# Patient Record
Sex: Female | Born: 1950 | Race: White | Hispanic: No | Marital: Married | Smoking: Never smoker
Health system: Southern US, Community
[De-identification: ages and names within clinical notes are randomized; demographics above are authoritative.]

## PROBLEM LIST (undated history)

## (undated) DIAGNOSIS — H269 Unspecified cataract: Secondary | ICD-10-CM

## (undated) DIAGNOSIS — I10 Essential (primary) hypertension: Secondary | ICD-10-CM

## (undated) DIAGNOSIS — I341 Nonrheumatic mitral (valve) prolapse: Secondary | ICD-10-CM

## (undated) DIAGNOSIS — G43909 Migraine, unspecified, not intractable, without status migrainosus: Secondary | ICD-10-CM

## (undated) HISTORY — DX: Nonrheumatic mitral (valve) prolapse: I34.1

## (undated) HISTORY — PX: BREAST BIOPSY: SHX20

## (undated) HISTORY — DX: Migraine, unspecified, not intractable, without status migrainosus: G43.909

## (undated) HISTORY — DX: Unspecified cataract: H26.9

## (undated) HISTORY — PX: MENISCUS REPAIR: SHX5179

## (undated) HISTORY — DX: Essential (primary) hypertension: I10

---

## 1990-08-03 HISTORY — PX: ABDOMINAL HYSTERECTOMY: SHX81

## 1991-08-04 HISTORY — PX: BREAST SURGERY: SHX581

## 2010-03-26 ENCOUNTER — Encounter: Payer: Self-pay | Admitting: Emergency Medicine

## 2010-04-09 ENCOUNTER — Encounter: Payer: Self-pay | Admitting: Emergency Medicine

## 2010-05-19 ENCOUNTER — Encounter: Payer: Self-pay | Admitting: Emergency Medicine

## 2010-07-05 ENCOUNTER — Encounter: Payer: Self-pay | Admitting: Emergency Medicine

## 2010-09-15 ENCOUNTER — Encounter: Payer: Self-pay | Admitting: Emergency Medicine

## 2010-09-15 ENCOUNTER — Institutional Professional Consult (permissible substitution) (INDEPENDENT_AMBULATORY_CARE_PROVIDER_SITE_OTHER): Payer: BC Managed Care – PPO | Admitting: Emergency Medicine

## 2010-09-15 DIAGNOSIS — I10 Essential (primary) hypertension: Secondary | ICD-10-CM | POA: Insufficient documentation

## 2010-09-15 DIAGNOSIS — R9389 Abnormal findings on diagnostic imaging of other specified body structures: Secondary | ICD-10-CM | POA: Insufficient documentation

## 2010-09-15 DIAGNOSIS — J309 Allergic rhinitis, unspecified: Secondary | ICD-10-CM | POA: Insufficient documentation

## 2010-09-24 NOTE — Assessment & Plan Note (Signed)
Summary: abnormal CT scan, RUL scarring   Visit Type:  Initial Consult Primary Provider/Referring Provider:  Dr. Maye Hides  CC:  Pt here for abnormal CT chest..  History of Present Illness: 60 yo former minimal smoker, hx HTN, allergies.  Established care with Dr Jonny Ruiz [facility]ier, [place]) in Fall 2011. Felt well at the time, asymptomatic. Had a screening CXR that questioned R sided infiltrate. Persisted on repeat CXR, which prompted CT scan of the chest in 12/11.   Preventive Screening-Counseling & Management  Alcohol-Tobacco     Smoking Status: quit     Packs/Day: 0.75     Year Started: 1975     Year Quit: 1980     Pack years: 3  Current Medications (verified): 1)  Micardis Hct 40-12.5 Mg Tabs (Telmisartan-Hctz) .Marland Kitchen.. 1 By Mouth Daily 2)  Multivitamins  Tabs (Multiple Vitamin) .Marland Kitchen.. 1 By Mouth Daily 3)  Vitamin D3 1000 Unit Tabs (Cholecalciferol) .Marland Kitchen.. 1 By Mouth Daily 4)  Calcium 1500 Mg Tabs (Calcium Carbonate) .Marland Kitchen.. 1 By Mouth Daily 5)  Fish Oil 500 Mg Caps (Omega-3 Fatty Acids) .Marland Kitchen.. 1 By Mouth Daily 6)  Alfalfa 250 Mg Tabs (Alfalfa) .Marland Kitchen.. 1 By Mouth As Needed  Allergies (verified): 1)  ! Sulfa  Past History:  Past Medical History: Current Problems:  HYPERTENSION (ICD-401.9) ALLERGIC RHINITIS (ICD-477.9)  Past Surgical History: Hysterectomy  in 02/1991  Family History: Seasonal allergies --- mother and father Melanoma ---father  Social History: Patient states former smoker.  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 No inhaled exposures. Smoking Status:  quit Packs/Day:  0.75 Pack years:  3  Review of Systems       The patient complains of nasal congestion/difficulty breathing through nose and change in color of mucus.  The patient denies shortness of breath with activity, shortness of breath at rest, productive cough, non-productive cough, coughing up blood, chest pain,  irregular heartbeats, acid heartburn, indigestion, loss of appetite, weight change, abdominal pain, difficulty swallowing, sore throat, tooth/dental problems, headaches, sneezing, itching, ear ache, anxiety, depression, hand/feet swelling, joint stiffness or pain, rash, and fever.    Vital Signs:  Patient profile:   60 year old female Height:      63 inches (160.02 cm) Weight:      160 pounds (72.73 kg) BMI:     28.45 O2 Sat:      98 % on Room air Temp:     98.3 degrees F (36.83 degrees C) oral Pulse rate:   106 / minute BP sitting:   110 / 64  (right arm) Cuff size:   regular  Vitals Entered By: Michel Bickers CMA (September 15, 2010 9:55 AM)  O2 Sat at Rest %:  98 O2 Flow:  Room air CC: Pt here for abnormal CT chest. Is Patient Diabetic? No Comments Medications reviewed with patient Michel Bickers CMA  September 15, 2010 9:55 AM   Physical Exam  General:  normal appearance and healthy appearing.   Head:  normocephalic and atraumatic Eyes:  conjunctiva and sclera clear Nose:  no deformity, discharge, inflammation, or lesions Mouth:  no deformity or lesions Neck:  no masses, thyromegaly, or abnormal cervical nodes Lungs:  clear bilaterally to auscultation and percussion Heart:  regular rate and rhythm, S1, S2 without murmurs, rubs, gallops, or clicks Abdomen:  not examined Extremities:  no clubbing, cyanosis, edema, or deformity noted Neurologic:  non-focal  Skin:  intact  without lesions or rashes Psych:  alert and cooperative; normal mood and affect; normal attention span and concentration   Impression & Recommendations:  Problem # 1:  OTH NONSPC ABN FINDNG RAD&OTH EXM BODY STRUCTURE (ICD-793.99)  ABnormal CT scan of the chest with non-specific interstitial prominence, no mass or nodules. There are calcified mediastinal nodes consistent w Histo. I suspect this is a chronic and harmless finding - she is asymptomatic.  - will plan to follow this w a CXR in 1 year or sooner if she  develops any symptoms.   Orders: Consultation Level IV (16109)  Medications Added to Medication List This Visit: 1)  Micardis Hct 40-12.5 Mg Tabs (Telmisartan-hctz) .Marland Kitchen.. 1 by mouth daily 2)  Multivitamins Tabs (Multiple vitamin) .Marland Kitchen.. 1 by mouth daily 3)  Vitamin D3 1000 Unit Tabs (Cholecalciferol) .Marland Kitchen.. 1 by mouth daily 4)  Calcium 1500 Mg Tabs (Calcium carbonate) .Marland Kitchen.. 1 by mouth daily 5)  Fish Oil 500 Mg Caps (Omega-3 fatty acids) .Marland Kitchen.. 1 by mouth daily 6)  Alfalfa 250 Mg Tabs (Alfalfa) .Marland Kitchen.. 1 by mouth as needed  Patient Instructions: 1)  Your CXR's and CT scan show an area on the right that looks like scarring.  2)  You should have a repeat CXR in a year (and probably annually) to insure that this area is stable. We would repeat your CXR sooner if you developed any breathing symptoms.  3)  Follow up with Dr Delton Coombes in 1 year to review a CXR.

## 2011-03-27 ENCOUNTER — Ambulatory Visit: Payer: BC Managed Care – PPO | Admitting: Family Medicine

## 2011-04-03 ENCOUNTER — Ambulatory Visit (INDEPENDENT_AMBULATORY_CARE_PROVIDER_SITE_OTHER): Payer: BC Managed Care – PPO | Admitting: Family Medicine

## 2011-04-03 ENCOUNTER — Encounter: Payer: Self-pay | Admitting: Family Medicine

## 2011-04-03 VITALS — BP 112/80 | HR 68 | Temp 98.2°F | Ht 63.0 in | Wt 161.0 lb

## 2011-04-03 DIAGNOSIS — I10 Essential (primary) hypertension: Secondary | ICD-10-CM

## 2011-04-03 LAB — POCT URINALYSIS DIPSTICK
Bilirubin, UA: NEGATIVE
Glucose, UA: NEGATIVE
Leukocytes, UA: NEGATIVE
Urobilinogen, UA: 0.2
pH, UA: 6.5

## 2011-04-03 LAB — BASIC METABOLIC PANEL
CO2: 27 mEq/L (ref 19–32)
Calcium: 9.6 mg/dL (ref 8.4–10.5)
Chloride: 110 mEq/L (ref 96–112)
Creatinine, Ser: 0.8 mg/dL (ref 0.4–1.2)
GFR: 78.24 mL/min (ref >60.00)
Potassium: 4.7 mEq/L (ref 3.5–5.1)

## 2011-04-03 LAB — CBC WITH DIFFERENTIAL/PLATELET
Basophils Relative: 0.5 % (ref 0.0–3.0)
Hemoglobin: 14.1 g/dL (ref 12.0–15.0)
Lymphocytes Relative: 32.6 % (ref 12.0–46.0)
Monocytes Relative: 6.4 % (ref 3.0–12.0)
Neutro Abs: 3.3 10*3/uL (ref 1.4–7.7)
RBC: 4.77 Mil/uL (ref 3.87–5.11)
WBC: 5.8 10*3/uL (ref 4.5–10.5)

## 2011-04-03 LAB — TSH: TSH: 2.44 u[IU]/mL (ref 0.35–5.50)

## 2011-04-03 LAB — LIPID PANEL
LDL Cholesterol: 88 mg/dL (ref 0–99)
Total CHOL/HDL Ratio: 4

## 2011-04-03 LAB — HEPATIC FUNCTION PANEL: Albumin: 4.2 g/dL (ref 3.5–5.2)

## 2011-04-03 MED ORDER — LOSARTAN POTASSIUM 50 MG PO TABS: 50.0000 mg | ORAL_TABLET | Freq: Every day | ORAL | Status: DC

## 2011-04-03 NOTE — Progress Notes (Signed)
  Subjective:    Patient ID: Kelly Holder, female    DOB: Jan 23, 1951, 60 y.o.   MRN: [id number]  HPI 60 yr old female to establish with [place] after transfering from Dr. Maye Hides in [facility]. She feels well with no concerns. She needs refills on her BP med. She is exercising and trying to lose weight. 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    Review of Systems  Constitutional: Negative.   Respiratory: Negative.   Cardiovascular: Negative.        Objective:   Physical Exam  Constitutional: She appears well-developed and well-nourished.  Neck: No thyromegaly present.  Cardiovascular: Normal rate, regular rhythm, normal heart sounds and intact distal pulses.  Exam reveals no gallop and no friction rub.   No murmur heard. Pulmonary/Chest: Effort normal and breath sounds normal. No respiratory distress. She has no wheezes. She has no rales.  Lymphadenopathy:    She has no cervical adenopathy.          Assessment & Plan:  Her HTN is stable, so we refilled her meds. Get fasting labs today Continue with weight loss efforts.

## 2012-04-07 ENCOUNTER — Other Ambulatory Visit: Payer: Self-pay | Admitting: Family Medicine

## 2012-04-08 NOTE — Telephone Encounter (Signed)
Call in #30 only, she needs an OV  

## 2012-04-08 NOTE — Telephone Encounter (Signed)
Can we refill this? 

## 2012-04-11 ENCOUNTER — Other Ambulatory Visit (INDEPENDENT_AMBULATORY_CARE_PROVIDER_SITE_OTHER): Payer: BC Managed Care – PPO

## 2012-04-11 DIAGNOSIS — Z0000 Encounter for general adult medical examination without abnormal findings: Secondary | ICD-10-CM

## 2012-04-11 LAB — HEPATIC FUNCTION PANEL
ALT: 24 U/L (ref 0–35)
AST: 26 U/L (ref 0–37)
Alkaline Phosphatase: 59 U/L (ref 39–117)
Bilirubin, Direct: 0.1 mg/dL (ref 0.0–0.3)
Total Bilirubin: 0.4 mg/dL (ref 0.3–1.2)
Total Protein: 6.7 g/dL (ref 6.0–8.3)

## 2012-04-11 LAB — CBC WITH DIFFERENTIAL/PLATELET
Basophils Relative: 0.7 % (ref 0.0–3.0)
Eosinophils Relative: 3.4 % (ref 0.0–5.0)
MCV: 89 fl (ref 78.0–100.0)
Monocytes Absolute: 0.4 10*3/uL (ref 0.1–1.0)
Monocytes Relative: 6.8 % (ref 3.0–12.0)
Neutrophils Relative %: 57.8 % (ref 43.0–77.0)
Platelets: 165 10*3/uL (ref 150.0–400.0)
RBC: 4.67 Mil/uL (ref 3.87–5.11)
WBC: 6.2 10*3/uL (ref 4.5–10.5)

## 2012-04-11 LAB — BASIC METABOLIC PANEL
BUN: 9 mg/dL (ref 6–23)
Chloride: 107 mEq/L (ref 96–112)
Creatinine, Ser: 0.9 mg/dL (ref 0.4–1.2)
GFR: 66.35 mL/min (ref >60.00)
Potassium: 4.6 mEq/L (ref 3.5–5.1)

## 2012-04-11 LAB — POCT URINALYSIS DIPSTICK
Glucose, UA: NEGATIVE
Nitrite, UA: NEGATIVE
Protein, UA: NEGATIVE
Spec Grav, UA: <=1.005
Urobilinogen, UA: 0.2

## 2012-04-11 LAB — LIPID PANEL
Cholesterol: 136 mg/dL (ref 0–200)
LDL Cholesterol: 79 mg/dL (ref 0–99)
Total CHOL/HDL Ratio: 4
VLDL: 20.6 mg/dL (ref 0.0–40.0)

## 2012-04-15 NOTE — Progress Notes (Signed)
Quick Note:  I left voice message with results. ______ 

## 2012-05-16 ENCOUNTER — Ambulatory Visit (INDEPENDENT_AMBULATORY_CARE_PROVIDER_SITE_OTHER): Payer: BC Managed Care – PPO | Admitting: Family Medicine

## 2012-05-16 ENCOUNTER — Encounter: Payer: Self-pay | Admitting: Family Medicine

## 2012-05-16 VITALS — BP 110/68 | HR 83 | Temp 98.1°F | Ht 63.25 in | Wt 149.0 lb

## 2012-05-16 DIAGNOSIS — Z23 Encounter for immunization: Secondary | ICD-10-CM

## 2012-05-16 DIAGNOSIS — Z0000 Encounter for general adult medical examination without abnormal findings: Secondary | ICD-10-CM

## 2012-05-16 MED ORDER — LOSARTAN POTASSIUM 50 MG PO TABS: 50.0000 mg | ORAL_TABLET | Freq: Every day | ORAL | Status: DC

## 2012-05-16 NOTE — Progress Notes (Signed)
  Subjective:    Patient ID: Kelly Holder, female    DOB: 01-17-1951, 61 y.o.   MRN: [id number]  HPI 61 yr old female for a cpx. She feels well and has no concerns.    Review of Systems  Constitutional: Negative.   HENT: Negative.   Eyes: Negative.   Respiratory: Negative.   Cardiovascular: Negative.   Gastrointestinal: Negative.   Genitourinary: Negative for dysuria, urgency, frequency, hematuria, flank pain, decreased urine volume, enuresis, difficulty urinating, pelvic pain and dyspareunia.  Musculoskeletal: Negative.   Skin: Negative.   Neurological: Negative.   Hematological: Negative.   Psychiatric/Behavioral: Negative.        Objective:   Physical Exam  Constitutional: She is oriented to person, place, and time. She appears well-developed and well-nourished. No distress.  HENT:  Head: Normocephalic and atraumatic.  Right Ear: External ear normal.  Left Ear: External ear normal.  Nose: Nose normal.  Mouth/Throat: Oropharynx is clear and moist. No oropharyngeal exudate.  Eyes: Conjunctivae normal and EOM are normal. Pupils are equal, round, and reactive to light. No scleral icterus.  Neck: Normal range of motion. Neck supple. No JVD present. No thyromegaly present.  Cardiovascular: Normal rate, regular rhythm, normal heart sounds and intact distal pulses.  Exam reveals no gallop and no friction rub.   No murmur heard. Pulmonary/Chest: Effort normal and breath sounds normal. No respiratory distress. She has no wheezes. She has no rales. She exhibits no tenderness.  Abdominal: Soft. Bowel sounds are normal. She exhibits no distension and no mass. There is no tenderness. There is no rebound and no guarding.  Musculoskeletal: Normal range of motion. She exhibits no edema and no tenderness.  Lymphadenopathy:    She has no cervical adenopathy.  Neurological: She is alert and oriented to person, place, and time. She has normal reflexes. No cranial nerve deficit. She exhibits  normal muscle tone. Coordination normal.  Skin: Skin is warm and dry. No rash noted. No erythema.  Psychiatric: She has a normal mood and affect. Her behavior is normal. Judgment and thought content normal.          Assessment & Plan:  Well exam. She needs a mammogram soon.

## 2012-05-16 NOTE — Addendum Note (Signed)
Addended by: Aniceto Boss A on: 05/16/2012 05:23 PM   Modules accepted: Orders

## 2012-08-01 ENCOUNTER — Telehealth: Payer: Self-pay | Admitting: Family Medicine

## 2012-08-01 NOTE — Telephone Encounter (Signed)
Pt would like her meds sent to new pharm: [facility]. Pls change. Thank you!!

## 2012-08-02 MED ORDER — LOSARTAN POTASSIUM 50 MG PO TABS: 50.0000 mg | ORAL_TABLET | Freq: Every day | ORAL | Status: DC

## 2012-08-02 NOTE — Telephone Encounter (Signed)
I sent script [facility]. 

## 2012-09-22 ENCOUNTER — Encounter: Payer: Self-pay | Admitting: Family Medicine

## 2012-11-22 ENCOUNTER — Other Ambulatory Visit: Payer: Self-pay | Admitting: Family Medicine

## 2013-02-19 ENCOUNTER — Other Ambulatory Visit: Payer: Self-pay | Admitting: Family Medicine

## 2013-02-27 ENCOUNTER — Ambulatory Visit (INDEPENDENT_AMBULATORY_CARE_PROVIDER_SITE_OTHER): Payer: BC Managed Care – PPO | Admitting: Family Medicine

## 2013-02-27 ENCOUNTER — Encounter: Payer: Self-pay | Admitting: Family Medicine

## 2013-02-27 VITALS — BP 122/66 | HR 79 | Temp 98.3°F | Wt 148.0 lb

## 2013-02-27 DIAGNOSIS — I10 Essential (primary) hypertension: Secondary | ICD-10-CM

## 2013-02-27 MED ORDER — LOSARTAN POTASSIUM 50 MG PO TABS: 50.0000 mg | ORAL_TABLET | Freq: Every day | ORAL | Status: DC

## 2013-02-27 NOTE — Progress Notes (Signed)
  Subjective:    Patient ID: Kelly Holder, female    DOB: [birth year], 62 y.o.   MRN: [id number]  HPI Here to follow up. Her BP is stable and she feels great. She has lost 25 lbs in the past year.    Review of Systems  Constitutional: Negative.   Respiratory: Negative.   Cardiovascular: Negative.        Objective:   Physical Exam  Constitutional: She appears well-developed and well-nourished.  Cardiovascular: Normal rate, regular rhythm, normal heart sounds and intact distal pulses.   Pulmonary/Chest: Effort normal and breath sounds normal.          Assessment & Plan:  Doing well. refilled meds.

## 2013-04-21 ENCOUNTER — Other Ambulatory Visit: Payer: Self-pay | Admitting: Family Medicine

## 2013-04-21 ENCOUNTER — Telehealth: Payer: Self-pay | Admitting: Family Medicine

## 2013-04-21 DIAGNOSIS — Z0000 Encounter for general adult medical examination without abnormal findings: Secondary | ICD-10-CM

## 2013-04-21 NOTE — Telephone Encounter (Signed)
Pt came in for med ck on 7/28. Was unable to do labs b/c pt was not fasting. Pt doesn't think she needs a cpe this year since she was just in. Pt would like to know if we would sch fasting cpe labs for her? Pt needs for wellness program for her employer

## 2013-04-21 NOTE — Telephone Encounter (Signed)
I put future lab order in computer, can you call pt to schedule the lab appointment? 

## 2013-04-21 NOTE — Telephone Encounter (Signed)
I agree, no need for a cpx. Set her up for fasting labs though

## 2013-04-24 NOTE — Telephone Encounter (Addendum)
done/kh

## 2013-04-25 ENCOUNTER — Encounter: Payer: Self-pay | Admitting: Family Medicine

## 2013-04-28 ENCOUNTER — Other Ambulatory Visit (INDEPENDENT_AMBULATORY_CARE_PROVIDER_SITE_OTHER): Payer: BC Managed Care – PPO

## 2013-04-28 DIAGNOSIS — Z0000 Encounter for general adult medical examination without abnormal findings: Secondary | ICD-10-CM

## 2013-04-28 LAB — LIPID PANEL
HDL: 45.9 mg/dL (ref >39.00)
LDL Cholesterol: 109 mg/dL — ABNORMAL HIGH (ref 0–99)
Total CHOL/HDL Ratio: 4
VLDL: 25.6 mg/dL (ref 0.0–40.0)

## 2013-04-28 LAB — HEPATIC FUNCTION PANEL
ALT: 19 U/L (ref 0–35)
Alkaline Phosphatase: 52 U/L (ref 39–117)
Total Bilirubin: 0.7 mg/dL (ref 0.3–1.2)

## 2013-04-28 LAB — CBC WITH DIFFERENTIAL/PLATELET
Basophils Relative: 0.6 % (ref 0.0–3.0)
Eosinophils Relative: 2.7 % (ref 0.0–5.0)
Hemoglobin: 14.8 g/dL (ref 12.0–15.0)
Lymphocytes Relative: 35.6 % (ref 12.0–46.0)
MCV: 87.8 fl (ref 78.0–100.0)
Monocytes Relative: 6.4 % (ref 3.0–12.0)
Neutrophils Relative %: 54.7 % (ref 43.0–77.0)
Platelets: 196 10*3/uL (ref 150.0–400.0)
RBC: 5 Mil/uL (ref 3.87–5.11)
WBC: 7.2 10*3/uL (ref 4.5–10.5)

## 2013-04-28 LAB — POCT URINALYSIS DIPSTICK
Bilirubin, UA: NEGATIVE
Glucose, UA: NEGATIVE
Leukocytes, UA: NEGATIVE
Nitrite, UA: NEGATIVE
Protein, UA: NEGATIVE
Spec Grav, UA: <=1.005
Urobilinogen, UA: 0.2

## 2013-04-28 LAB — BASIC METABOLIC PANEL
BUN: 10 mg/dL (ref 6–23)
CO2: 26 mEq/L (ref 19–32)
Chloride: 104 mEq/L (ref 96–112)
Glucose, Bld: 85 mg/dL (ref 70–99)
Potassium: 4.7 mEq/L (ref 3.5–5.1)

## 2013-05-01 NOTE — Progress Notes (Signed)
Quick Note:  I released results in my chart. ______ 

## 2013-06-08 ENCOUNTER — Other Ambulatory Visit: Payer: Self-pay

## 2013-10-11 ENCOUNTER — Telehealth: Payer: Self-pay | Admitting: Family Medicine

## 2013-10-11 NOTE — Telephone Encounter (Signed)
[facility] - [place], [place] requesting new script for losartan (COZAAR) 50 MG tablet

## 2013-10-12 MED ORDER — LOSARTAN POTASSIUM 50 MG PO TABS: 50.0000 mg | ORAL_TABLET | Freq: Every day | ORAL | Status: DC

## 2013-10-12 NOTE — Telephone Encounter (Signed)
I sent script [facility]. 

## 2013-10-31 ENCOUNTER — Telehealth: Payer: Self-pay | Admitting: Family Medicine

## 2013-10-31 NOTE — Telephone Encounter (Signed)
Left message on machine for patient to call back and schedule an appointment. 

## 2013-10-31 NOTE — Telephone Encounter (Signed)
Patient Information:  Caller Name: Maie  Phone: [phone]  Patient: Kelly Holder  Gender: Female  DOB: 1951/06/10  Age: 63 Years  PCP: [name] [facility])  Office Follow Up:  Does the office need to follow up with this patient?: Yes  Instructions For The Office: Please f/u with pt concerning high blood pressure reading over the past few days.  Thank you.   Symptoms  Reason For Call & Symptoms: Pt reports she has history of HTN and BP 170/100, symptoms dizziness, headache  on pain scale 1 top of the head to over the ears. Pt concerned and would like to talk with provider concerning change in medication.  Reviewed Health History In EMR: Yes  Reviewed Medications In EMR: Yes  Reviewed Allergies In EMR: Yes  Reviewed Surgeries / Procedures: Yes  Date of Onset of Symptoms: 10/31/2013  Guideline(s) Used:  High Blood Pressure  Disposition Per Guideline:   See Within 2 Weeks in Office  Reason For Disposition Reached:   BP > 160/100  Advice Given:  Call Back If:  You become worse.  Patient Refused Recommendation:  Patient Requests Prescription  Pt asking about medication adjustment.  [facility] uses [facility] [place], [place]

## 2013-10-31 NOTE — Telephone Encounter (Signed)
Pt has been sch for tommorrow

## 2013-11-01 ENCOUNTER — Telehealth: Payer: Self-pay | Admitting: Family Medicine

## 2013-11-01 ENCOUNTER — Ambulatory Visit (INDEPENDENT_AMBULATORY_CARE_PROVIDER_SITE_OTHER): Payer: BC Managed Care – PPO | Admitting: Family Medicine

## 2013-11-01 ENCOUNTER — Encounter: Payer: Self-pay | Admitting: Family Medicine

## 2013-11-01 VITALS — BP 132/70 | HR 73 | Temp 98.7°F | Ht 63.25 in | Wt 150.0 lb

## 2013-11-01 DIAGNOSIS — I10 Essential (primary) hypertension: Secondary | ICD-10-CM

## 2013-11-01 MED ORDER — HYDROCHLOROTHIAZIDE 12.5 MG PO CAPS: 12.5000 mg | ORAL_CAPSULE | Freq: Every day | ORAL | Status: DC

## 2013-11-01 MED ORDER — LOSARTAN POTASSIUM-HCTZ 50-12.5 MG PO TABS: 1.0000 | ORAL_TABLET | Freq: Every day | ORAL | Status: DC

## 2013-11-01 NOTE — Telephone Encounter (Signed)
Relevant patient education assigned to patient using Emmi. ° °

## 2013-11-01 NOTE — Progress Notes (Signed)
Pre visit review using our clinic review tool, if applicable. No additional management support is needed unless otherwise documented below in the visit note. 

## 2013-11-01 NOTE — Progress Notes (Signed)
   Subjective:    Patient ID: Kelly Holder, female    DOB: 14-Jan-1951, 63 y.o.   MRN: [id number]  HPI Here with concerns over her BP. Over the past 6 months she has gained about 10 lbs and she has stopped exercising. She has been dealing with more stress at work. She had a HA about 10 days ago and found her BP to be 180/100. She has checked it since then about twice a day and it averages 140-160/90. The HA has resolved. No chest pain or SOB.    Review of Systems  Constitutional: Negative.   Respiratory: Negative.   Cardiovascular: Negative.   Neurological: Negative.        Objective:   Physical Exam  Constitutional: She is oriented to person, place, and time. She appears well-developed and well-nourished.  Pulmonary/Chest: Effort normal and breath sounds normal.  Abdominal: Soft. Bowel sounds are normal.  Neurological: She is alert and oriented to person, place, and time.          Assessment & Plan:  We will change to Losartan HCT 50/12.5 daily. Recheck with her cpx in 3 months

## 2014-01-23 ENCOUNTER — Other Ambulatory Visit: Payer: BC Managed Care – PPO

## 2014-02-09 ENCOUNTER — Encounter: Payer: BC Managed Care – PPO | Admitting: Family Medicine

## 2014-03-14 ENCOUNTER — Other Ambulatory Visit (INDEPENDENT_AMBULATORY_CARE_PROVIDER_SITE_OTHER): Payer: BC Managed Care – PPO

## 2014-03-14 DIAGNOSIS — Z0000 Encounter for general adult medical examination without abnormal findings: Secondary | ICD-10-CM

## 2014-03-14 LAB — CBC WITH DIFFERENTIAL/PLATELET
BASOS ABS: 0 10*3/uL (ref 0.0–0.1)
Basophils Relative: 0.6 % (ref 0.0–3.0)
EOS ABS: 0.2 10*3/uL (ref 0.0–0.7)
Eosinophils Relative: 2.2 % (ref 0.0–5.0)
HCT: 42 % (ref 36.0–46.0)
HEMOGLOBIN: 14.1 g/dL (ref 12.0–15.0)
LYMPHS PCT: 35.4 % (ref 12.0–46.0)
Lymphs Abs: 2.7 10*3/uL (ref 0.7–4.0)
MCHC: 33.6 g/dL (ref 30.0–36.0)
MCV: 87.9 fl (ref 78.0–100.0)
Monocytes Absolute: 0.6 10*3/uL (ref 0.1–1.0)
Monocytes Relative: 7.8 % (ref 3.0–12.0)
Neutro Abs: 4.1 10*3/uL (ref 1.4–7.7)
Neutrophils Relative %: 54 % (ref 43.0–77.0)
PLATELETS: 204 10*3/uL (ref 150.0–400.0)
RBC: 4.78 Mil/uL (ref 3.87–5.11)
RDW: 13.3 % (ref 11.5–15.5)
WBC: 7.6 10*3/uL (ref 4.0–10.5)

## 2014-03-14 LAB — BASIC METABOLIC PANEL
BUN: 12 mg/dL (ref 6–23)
CALCIUM: 9.9 mg/dL (ref 8.4–10.5)
CHLORIDE: 101 meq/L (ref 96–112)
CO2: 29 mEq/L (ref 19–32)
Creatinine, Ser: 0.9 mg/dL (ref 0.4–1.2)
GFR: 66.76 mL/min (ref >60.00)
Glucose, Bld: 86 mg/dL (ref 70–99)
Potassium: 4.5 mEq/L (ref 3.5–5.1)
Sodium: 141 mEq/L (ref 135–145)

## 2014-03-14 LAB — LIPID PANEL
CHOLESTEROL: 174 mg/dL (ref 0–200)
HDL: 35.9 mg/dL — AB (ref >39.00)
LDL CALC: 99 mg/dL (ref 0–99)
NonHDL: 138.1
TRIGLYCERIDES: 197 mg/dL — AB (ref 0.0–149.0)
Total CHOL/HDL Ratio: 5
VLDL: 39.4 mg/dL (ref 0.0–40.0)

## 2014-03-14 LAB — HEPATIC FUNCTION PANEL
ALT: 35 U/L (ref 0–35)
AST: 43 U/L — AB (ref 0–37)
Albumin: 4.2 g/dL (ref 3.5–5.2)
Alkaline Phosphatase: 71 U/L (ref 39–117)
BILIRUBIN DIRECT: 0 mg/dL (ref 0.0–0.3)
Total Bilirubin: 0.9 mg/dL (ref 0.2–1.2)
Total Protein: 7.1 g/dL (ref 6.0–8.3)

## 2014-03-14 LAB — POCT URINALYSIS DIPSTICK
Bilirubin, UA: NEGATIVE
Blood, UA: NEGATIVE
GLUCOSE UA: NEGATIVE
Ketones, UA: NEGATIVE
Leukocytes, UA: NEGATIVE
Nitrite, UA: NEGATIVE
Protein, UA: NEGATIVE
Spec Grav, UA: 1.01
UROBILINOGEN UA: 0.2
pH, UA: 7.5

## 2014-03-14 LAB — TSH: TSH: 0.47 u[IU]/mL (ref 0.35–4.50)

## 2014-03-23 ENCOUNTER — Encounter: Payer: Self-pay | Admitting: Family Medicine

## 2014-03-23 ENCOUNTER — Ambulatory Visit (INDEPENDENT_AMBULATORY_CARE_PROVIDER_SITE_OTHER): Payer: BC Managed Care – PPO | Admitting: Family Medicine

## 2014-03-23 VITALS — BP 130/90 | Temp 98.7°F | Ht 63.25 in | Wt 156.0 lb

## 2014-03-23 DIAGNOSIS — I10 Essential (primary) hypertension: Secondary | ICD-10-CM

## 2014-03-23 DIAGNOSIS — Z0000 Encounter for general adult medical examination without abnormal findings: Secondary | ICD-10-CM

## 2014-03-23 MED ORDER — LOSARTAN POTASSIUM-HCTZ 100-25 MG PO TABS: 1.0000 | ORAL_TABLET | Freq: Every day | ORAL | Status: DC

## 2014-03-23 NOTE — Progress Notes (Signed)
   Subjective:    Patient ID: Kelly Holder, female    DOB: 1951-06-26, 63 y.o.   MRN: [id number]  HPI 63 yr old female for a cpx. She feels well in general.    Review of Systems  Constitutional: Negative.   HENT: Negative.   Eyes: Negative.   Respiratory: Negative.   Cardiovascular: Negative.   Gastrointestinal: Negative.   Genitourinary: Negative for dysuria, urgency, frequency, hematuria, flank pain, decreased urine volume, enuresis, difficulty urinating, pelvic pain and dyspareunia.  Musculoskeletal: Negative.   Skin: Negative.   Neurological: Negative.   Psychiatric/Behavioral: Negative.        Objective:   Physical Exam  Constitutional: She is oriented to person, place, and time. She appears well-developed and well-nourished. No distress.  HENT:  Head: Normocephalic and atraumatic.  Right Ear: External ear normal.  Left Ear: External ear normal.  Nose: Nose normal.  Mouth/Throat: Oropharynx is clear and moist. No oropharyngeal exudate.  Eyes: Conjunctivae and EOM are normal. Pupils are equal, round, and reactive to light. No scleral icterus.  Neck: Normal range of motion. Neck supple. No JVD present. No thyromegaly present.  Cardiovascular: Normal rate, regular rhythm, normal heart sounds and intact distal pulses.  Exam reveals no gallop and no friction rub.   No murmur heard. EKG normal   Pulmonary/Chest: Effort normal and breath sounds normal. No respiratory distress. She has no wheezes. She has no rales. She exhibits no tenderness.  Abdominal: Soft. Bowel sounds are normal. She exhibits no distension and no mass. There is no tenderness. There is no rebound and no guarding.  Musculoskeletal: Normal range of motion. She exhibits no edema and no tenderness.  Lymphadenopathy:    She has no cervical adenopathy.  Neurological: She is alert and oriented to person, place, and time. She has normal reflexes. No cranial nerve deficit. She exhibits normal muscle tone. Coordination  normal.  Skin: Skin is warm and dry. No rash noted. No erythema.  Psychiatric: She has a normal mood and affect. Her behavior is normal. Judgment and thought content normal.          Assessment & Plan:  Well exam. We will set up a colonoscopy.

## 2014-03-23 NOTE — Progress Notes (Signed)
Pre visit review using our clinic review tool, if applicable. No additional management support is needed unless otherwise documented below in the visit note. 

## 2014-04-05 ENCOUNTER — Telehealth: Payer: Self-pay | Admitting: Family Medicine

## 2014-04-05 NOTE — Telephone Encounter (Signed)
Pt would like dr fry to accept her hus as new pt Kelly Holder [phone]. Can I sch?

## 2014-04-06 NOTE — Telephone Encounter (Signed)
Dr Sarajane Jews said will except husband as new pt and he has been scheduled

## 2014-04-06 NOTE — Telephone Encounter (Signed)
Yes I can see her husband

## 2014-04-30 ENCOUNTER — Encounter: Payer: Self-pay | Admitting: Family Medicine

## 2014-05-09 ENCOUNTER — Encounter: Payer: Self-pay | Admitting: Internal Medicine

## 2014-06-04 ENCOUNTER — Telehealth: Payer: Self-pay | Admitting: *Deleted

## 2014-06-04 ENCOUNTER — Ambulatory Visit (AMBULATORY_SURGERY_CENTER): Payer: Self-pay | Admitting: *Deleted

## 2014-06-04 VITALS — Ht 63.0 in | Wt 152.2 lb

## 2014-06-04 DIAGNOSIS — Z1211 Encounter for screening for malignant neoplasm of colon: Secondary | ICD-10-CM

## 2014-06-04 NOTE — Telephone Encounter (Signed)
ROI faxed, will await records.

## 2014-06-04 NOTE — Telephone Encounter (Signed)
Pt states she has had two colons in the past in [place]. Records release to patty [place] cma for dr Carlean Purl. Pt has colon scheduled for 11-13 Friday at 3p with dr Carlean Purl. ewm   Lelan Pons PV

## 2014-06-04 NOTE — Progress Notes (Signed)
No egg or soy allergy. ewm No diet pills. No home 02 use. ewm No blood thinners. ewm Pt states she has had post op nausea/vomiting especially with general anesthesia.  no other issues. ewm Pt declined emmi. ewm

## 2014-06-06 NOTE — Addendum Note (Signed)
Addended by: Steva Ready on: 06/06/2014 09:39 AM   Modules accepted: Level of Service

## 2014-06-07 ENCOUNTER — Encounter: Payer: Self-pay | Admitting: Internal Medicine

## 2014-06-15 ENCOUNTER — Ambulatory Visit (AMBULATORY_SURGERY_CENTER): Payer: BC Managed Care – PPO | Admitting: Internal Medicine

## 2014-06-15 ENCOUNTER — Encounter: Payer: Self-pay | Admitting: Internal Medicine

## 2014-06-15 VITALS — BP 115/72 | HR 71 | Temp 97.9°F | Resp 16 | Ht 63.0 in | Wt 152.0 lb

## 2014-06-15 DIAGNOSIS — K621 Rectal polyp: Secondary | ICD-10-CM

## 2014-06-15 DIAGNOSIS — D128 Benign neoplasm of rectum: Secondary | ICD-10-CM

## 2014-06-15 DIAGNOSIS — D129 Benign neoplasm of anus and anal canal: Secondary | ICD-10-CM

## 2014-06-15 DIAGNOSIS — Z1211 Encounter for screening for malignant neoplasm of colon: Secondary | ICD-10-CM

## 2014-06-15 HISTORY — PX: COLONOSCOPY: SHX174

## 2014-06-15 MED ORDER — SODIUM CHLORIDE 0.9 % IV SOLN: 500.0000 mL | INTRAVENOUS | Status: DC

## 2014-06-15 NOTE — Patient Instructions (Addendum)
I found and removed one tiny rectal polyp. I also saw external hemorrhoids and diverticulosis.  I will let you know pathology results and when to have another routine colonoscopy by mail.  I appreciate the opportunity to care for you. Gatha Mayer, MD, FAC   YOU HAD AN ENDOSCOPIC PROCEDURE TODAY AT [facility]: Refer to the procedure report that was given to you for any specific questions about what was found during the examination.  If the procedure report does not answer your questions, please call your gastroenterologist to clarify.  If you requested that your care partner not be given the details of your procedure findings, then the procedure report has been included in a sealed envelope for you to review at your convenience later.  YOU SHOULD EXPECT: Some feelings of bloating in the abdomen. Passage of more gas than usual.  Walking can help get rid of the air that was put into your GI tract during the procedure and reduce the bloating. If you had a lower endoscopy (such as a colonoscopy or flexible sigmoidoscopy) you may notice spotting of blood in your stool or on the toilet paper. If you underwent a bowel prep for your procedure, then you may not have a normal bowel movement for a few days.  DIET: Your first meal following the procedure should be a light meal and then it is ok to progress to your normal diet.  A half-sandwich or bowl of soup is an example of a good first meal.  Heavy or fried foods are harder to digest and may make you feel nauseous or bloated.  Likewise meals heavy in dairy and vegetables can cause extra gas to form and this can also increase the bloating.  Drink plenty of fluids but you should avoid alcoholic beverages for 24 hours.  ACTIVITY: Your care partner should take you home directly after the procedure.  You should plan to take it easy, moving slowly for the rest of the day.  You can resume normal activity the day after the procedure  however you should NOT DRIVE or use heavy machinery for 24 hours (because of the sedation medicines used during the test).    SYMPTOMS TO REPORT IMMEDIATELY: A gastroenterologist can be reached at any hour.  During normal business hours, 8:30 AM to 5:00 PM Monday through Friday, call [phone].  After hours and on weekends, please call the GI answering service at [phone] who will take a message and have the physician on call contact you.   Following lower endoscopy (colonoscopy or flexible sigmoidoscopy):  Excessive amounts of blood in the stool  Significant tenderness or worsening of abdominal pains  Swelling of the abdomen that is new, acute  Fever of 100F or higher   FOLLOW UP: If any biopsies were taken you will be contacted by phone or by letter within the next 1-3 weeks.  Call your gastroenterologist if you have not heard about the biopsies in 3 weeks.  Our staff will call the home number listed on your records the next business day following your procedure to check on you and address any questions or concerns that you may have at that time regarding the information given to you following your procedure. This is a courtesy call and so if there is no answer at the home number and we have not heard from you through the emergency physician on call, we will assume that you have returned to your regular daily activities without incident.  SIGNATURES/CONFIDENTIALITY: You and/or your care partner have signed paperwork which will be entered into your electronic medical record.  These signatures attest to the fact that that the information above on your After Visit Summary has been reviewed and is understood.  Full responsibility of the confidentiality of this discharge information lies with you and/or your care-partner.  Polyp, diverticulosis and hemorrhoid information given.

## 2014-06-15 NOTE — Progress Notes (Signed)
Procedure ends, to recovery, report given and VSS. 

## 2014-06-15 NOTE — Progress Notes (Signed)
Called to room to assist during endoscopic procedure.  Patient ID and intended procedure confirmed with present staff. Received instructions for my participation in the procedure from the performing physician.  

## 2014-06-15 NOTE — Op Note (Signed)
[place]  [address] [place], [address]   COLONOSCOPY PROCEDURE REPORT  PATIENT: Kelly Holder, Kelly Holder  MR#: [id number] BIRTHDATE: [birth year] , 60  yrs. old GENDER: female ENDOSCOPIST: Gatha Mayer, MD, [facility] REFERRED BY: PROCEDURE DATE:  06/15/2014 PROCEDURE:   Colonoscopy with biopsy First Screening Colonoscopy - Avg.  risk and is 50 yrs.  old or older - No.  Prior Negative Screening - Now for repeat screening. 10 or more years since last screening  History of Adenoma - Now for follow-up colonoscopy & has been > or = to 3 yrs.  N/A  Polyps Removed Today? Yes. ASA CLASS:   Class II INDICATIONS:average risk for colorectal cancer. MEDICATIONS: Propofol 330 mg IV and Monitored anesthesia care  DESCRIPTION OF PROCEDURE:   After the risks benefits and alternatives of the procedure were thoroughly explained, informed consent was obtained.  The digital rectal exam revealed no abnormalities of the rectum.   The [id number]  endoscope was introduced through the anus and advanced to the cecum, which was identified by both the appendix and ileocecal valve. No adverse events experienced.   The quality of the prep was good, using MiraLax  The instrument was then slowly withdrawn as the colon was fully examined.      COLON FINDINGS: A sessile polyp measuring 3 mm in size was found in the rectum.  A polypectomy was performed with cold forceps.  The resection was complete, the polyp tissue was completely retrieved and sent to histology.   There was diverticulosis noted in the sigmoid colon.  Normal colon otherwise. Retroflexed views revealed no abnormalities. The time to cecum=2 minutes 53 seconds. Withdrawal time=9 minutes 52 seconds.  The scope was withdrawn and the procedure completed. COMPLICATIONS: There were no immediate complications.  ENDOSCOPIC IMPRESSION: 1.   Sessile polyp measuring 3 mm in size was found in the rectum; polypectomy was performed with  cold forceps 2.   Diverticulosis was noted in the sigmoid colon 3.   Normal colonoscopy otherwise  RECOMMENDATIONS: Timing of repeat colonoscopy will be determined by pathology findings.  eSigned:  Gatha Mayer, MD, [facility] 06/15/2014 3:37 PM   cc: The Patient   and [name], MD

## 2014-06-18 ENCOUNTER — Telehealth: Payer: Self-pay | Admitting: *Deleted

## 2014-06-18 NOTE — Telephone Encounter (Signed)
No answer, left message to call if questions or concerns. 

## 2014-06-19 ENCOUNTER — Encounter: Payer: Self-pay | Admitting: Internal Medicine

## 2014-06-19 NOTE — Progress Notes (Signed)
Quick Note:  Hyperplastic polyp Repeat colonoscopy 2025 ______

## 2014-06-21 NOTE — Telephone Encounter (Signed)
Procedure was completed 11-13  Kelly Holder

## 2014-07-24 ENCOUNTER — Encounter: Payer: Self-pay | Admitting: Family Medicine

## 2014-07-24 ENCOUNTER — Ambulatory Visit (INDEPENDENT_AMBULATORY_CARE_PROVIDER_SITE_OTHER): Payer: BC Managed Care – PPO | Admitting: Family Medicine

## 2014-07-24 VITALS — BP 138/86 | HR 70 | Temp 98.3°F | Ht 63.0 in | Wt 152.0 lb

## 2014-07-24 DIAGNOSIS — I10 Essential (primary) hypertension: Secondary | ICD-10-CM

## 2014-07-24 MED ORDER — POTASSIUM CHLORIDE ER 10 MEQ PO TBCR: 10.0000 meq | EXTENDED_RELEASE_TABLET | Freq: Every day | ORAL | Status: DC

## 2014-07-24 MED ORDER — AMLODIPINE BESYLATE 5 MG PO TABS
5.0000 mg | ORAL_TABLET | Freq: Every day | ORAL | Status: DC
Start: 2014-07-24 — End: 2014-08-20

## 2014-07-24 NOTE — Progress Notes (Signed)
Pre visit review using our clinic review tool, if applicable. No additional management support is needed unless otherwise documented below in the visit note. 

## 2014-07-24 NOTE — Progress Notes (Signed)
   Subjective:    Patient ID: Kelly Holder, female    DOB: Jun 16, 1951, 63 y.o.   MRN: [id number]  HPI Here to discuss high BP readings. She has been on Hyzaar for about a year with good control, but lately her BP has been as high as the 170s over upper 90s. She has felt tired and has had some muscle cramps. No chest pain or SOB or HAs.    Review of Systems  Constitutional: Negative.   Respiratory: Negative.   Cardiovascular: Negative.   Neurological: Negative.        Objective:   Physical Exam  Constitutional: She is oriented to person, place, and time. She appears well-developed and well-nourished.  Cardiovascular: Normal rate, regular rhythm, normal heart sounds and intact distal pulses.   Pulmonary/Chest: Effort normal and breath sounds normal.  Musculoskeletal: She exhibits no edema.  Neurological: She is alert and oriented to person, place, and time.          Assessment & Plan:  We will add Klor-con and Norvasc 5 mg daily. Recheck in 3 weeks with labs

## 2014-08-16 ENCOUNTER — Telehealth: Payer: Self-pay | Admitting: Family Medicine

## 2014-08-16 NOTE — Telephone Encounter (Signed)
Pt needs new rxs sent [facility] amlodipine 5 mg and klor-con 10 meq #90 W/REFILLS

## 2014-08-20 MED ORDER — POTASSIUM CHLORIDE ER 10 MEQ PO TBCR: 10.0000 meq | EXTENDED_RELEASE_TABLET | Freq: Every day | ORAL | Status: DC

## 2014-08-20 MED ORDER — AMLODIPINE BESYLATE 5 MG PO TABS: 5.0000 mg | ORAL_TABLET | Freq: Every day | ORAL | Status: DC

## 2014-08-20 NOTE — Telephone Encounter (Signed)
I sent both scripts e-scribe. 

## 2014-09-26 ENCOUNTER — Ambulatory Visit: Payer: BC Managed Care – PPO | Admitting: Family Medicine

## 2014-09-28 ENCOUNTER — Ambulatory Visit (INDEPENDENT_AMBULATORY_CARE_PROVIDER_SITE_OTHER): Payer: BLUE CROSS/BLUE SHIELD | Admitting: Family Medicine

## 2014-09-28 ENCOUNTER — Encounter: Payer: Self-pay | Admitting: Family Medicine

## 2014-09-28 VITALS — BP 110/68 | HR 75 | Temp 98.5°F | Ht 63.0 in | Wt 154.0 lb

## 2014-09-28 DIAGNOSIS — I10 Essential (primary) hypertension: Secondary | ICD-10-CM

## 2014-09-28 DIAGNOSIS — N63 Unspecified lump in unspecified breast: Secondary | ICD-10-CM

## 2014-09-28 NOTE — Progress Notes (Signed)
   Subjective:    Patient ID: Kelly Holder, female    DOB: 11-13-1950, 64 y.o.   MRN: [id number]  HPI Here to follow up on HTN and also to ask about getting a mammogram. Her last one was at [facility] on 09-22-12 and it was benign with scattered fibroglandular densities but nothing worrisome. She notes that she felt a small mass on the lateral right breast recently. This is not tender, she has had no nipple DC, etc. Otherwise she feels great and her BP has been stable.    Review of Systems  Constitutional: Negative.   Respiratory: Negative.   Cardiovascular: Negative.        Objective:   Physical Exam  Constitutional: She appears well-developed and well-nourished.  Cardiovascular: Normal rate, regular rhythm, normal heart sounds and intact distal pulses.   Pulmonary/Chest: Effort normal and breath sounds normal.  Genitourinary:  The left breast and axilla are clear. The right breast has a vague density at the 10 o'clock position along the anterior axillary line. No skin changes or nipple DC          Assessment & Plan:  Her HTN is stable. We will set her up for a diagnostic mammogram and an US of the right breast.

## 2014-09-28 NOTE — Progress Notes (Signed)
Pre visit review using our clinic review tool, if applicable. No additional management support is needed unless otherwise documented below in the visit note. 

## 2014-10-12 ENCOUNTER — Ambulatory Visit
Admission: RE | Admit: 2014-10-12 | Discharge: 2014-10-12 | Disposition: A | Discharge status: Home / Self Care | Payer: BLUE CROSS/BLUE SHIELD | Source: Ambulatory Visit | Attending: Family Medicine | Admitting: Family Medicine

## 2014-10-12 DIAGNOSIS — N63 Unspecified lump in unspecified breast: Secondary | ICD-10-CM

## 2014-10-12 LAB — HM MAMMOGRAPHY

## 2014-11-30 ENCOUNTER — Other Ambulatory Visit: Payer: Self-pay | Admitting: Family Medicine

## 2014-12-06 ENCOUNTER — Encounter (INDEPENDENT_AMBULATORY_CARE_PROVIDER_SITE_OTHER): Payer: BLUE CROSS/BLUE SHIELD | Admitting: Ophthalmology

## 2014-12-06 DIAGNOSIS — I10 Essential (primary) hypertension: Secondary | ICD-10-CM

## 2014-12-06 DIAGNOSIS — H2513 Age-related nuclear cataract, bilateral: Secondary | ICD-10-CM | POA: Diagnosis not present

## 2014-12-06 DIAGNOSIS — H43813 Vitreous degeneration, bilateral: Secondary | ICD-10-CM | POA: Diagnosis not present

## 2014-12-06 DIAGNOSIS — H35033 Hypertensive retinopathy, bilateral: Secondary | ICD-10-CM

## 2014-12-06 DIAGNOSIS — H4312 Vitreous hemorrhage, left eye: Secondary | ICD-10-CM

## 2014-12-06 DIAGNOSIS — H35372 Puckering of macula, left eye: Secondary | ICD-10-CM | POA: Diagnosis not present

## 2015-01-07 ENCOUNTER — Encounter (INDEPENDENT_AMBULATORY_CARE_PROVIDER_SITE_OTHER): Payer: BLUE CROSS/BLUE SHIELD | Admitting: Ophthalmology

## 2015-01-07 DIAGNOSIS — H35372 Puckering of macula, left eye: Secondary | ICD-10-CM

## 2015-01-07 DIAGNOSIS — I10 Essential (primary) hypertension: Secondary | ICD-10-CM

## 2015-01-07 DIAGNOSIS — D3132 Benign neoplasm of left choroid: Secondary | ICD-10-CM | POA: Diagnosis not present

## 2015-01-07 DIAGNOSIS — H2513 Age-related nuclear cataract, bilateral: Secondary | ICD-10-CM

## 2015-01-07 DIAGNOSIS — H4312 Vitreous hemorrhage, left eye: Secondary | ICD-10-CM

## 2015-01-07 DIAGNOSIS — H35033 Hypertensive retinopathy, bilateral: Secondary | ICD-10-CM

## 2015-01-07 DIAGNOSIS — H43813 Vitreous degeneration, bilateral: Secondary | ICD-10-CM | POA: Diagnosis not present

## 2015-01-24 ENCOUNTER — Encounter: Payer: Self-pay | Admitting: *Deleted

## 2015-02-18 ENCOUNTER — Encounter (INDEPENDENT_AMBULATORY_CARE_PROVIDER_SITE_OTHER): Payer: BLUE CROSS/BLUE SHIELD | Admitting: Ophthalmology

## 2015-02-18 DIAGNOSIS — H43813 Vitreous degeneration, bilateral: Secondary | ICD-10-CM | POA: Diagnosis not present

## 2015-02-18 DIAGNOSIS — H4312 Vitreous hemorrhage, left eye: Secondary | ICD-10-CM | POA: Diagnosis not present

## 2015-02-18 DIAGNOSIS — H2513 Age-related nuclear cataract, bilateral: Secondary | ICD-10-CM | POA: Diagnosis not present

## 2015-02-18 DIAGNOSIS — H35372 Puckering of macula, left eye: Secondary | ICD-10-CM

## 2015-02-18 DIAGNOSIS — I10 Essential (primary) hypertension: Secondary | ICD-10-CM

## 2015-02-18 DIAGNOSIS — H35033 Hypertensive retinopathy, bilateral: Secondary | ICD-10-CM

## 2015-03-25 ENCOUNTER — Telehealth: Payer: Self-pay | Admitting: Family Medicine

## 2015-03-25 MED ORDER — AMLODIPINE BESYLATE 5 MG PO TABS: 5.0000 mg | ORAL_TABLET | Freq: Every day | ORAL | Status: DC

## 2015-03-25 MED ORDER — POTASSIUM CHLORIDE ER 10 MEQ PO TBCR: 10.0000 meq | EXTENDED_RELEASE_TABLET | Freq: Every day | ORAL | Status: DC

## 2015-03-25 NOTE — Telephone Encounter (Signed)
Pt request refill :  amLODipine (NORVASC) 5 MG tablet potassium chloride (KLOR-CON 10) 10 MEQ tablet  [facility]  Pt has made appt for CPE, but is out of her meds. Can you end enough to local pharm to get her through?

## 2015-03-25 NOTE — Telephone Encounter (Signed)
I sent both scripts e-scribe, tried to reach pt and no answer or option to leave a message.

## 2015-04-15 ENCOUNTER — Other Ambulatory Visit (INDEPENDENT_AMBULATORY_CARE_PROVIDER_SITE_OTHER): Payer: BLUE CROSS/BLUE SHIELD

## 2015-04-15 DIAGNOSIS — Z0000 Encounter for general adult medical examination without abnormal findings: Secondary | ICD-10-CM

## 2015-04-15 LAB — CBC WITH DIFFERENTIAL/PLATELET
BASOS PCT: 0.7 % (ref 0.0–3.0)
Basophils Absolute: 0 10*3/uL (ref 0.0–0.1)
Eosinophils Absolute: 0.1 10*3/uL (ref 0.0–0.7)
Eosinophils Relative: 1.8 % (ref 0.0–5.0)
HEMATOCRIT: 41.8 % (ref 36.0–46.0)
Hemoglobin: 14 g/dL (ref 12.0–15.0)
LYMPHS PCT: 34.2 % (ref 12.0–46.0)
Lymphs Abs: 2.3 10*3/uL (ref 0.7–4.0)
MCHC: 33.5 g/dL (ref 30.0–36.0)
MCV: 86.8 fl (ref 78.0–100.0)
MONOS PCT: 7.8 % (ref 3.0–12.0)
Monocytes Absolute: 0.5 10*3/uL (ref 0.1–1.0)
NEUTROS ABS: 3.8 10*3/uL (ref 1.4–7.7)
Neutrophils Relative %: 55.5 % (ref 43.0–77.0)
PLATELETS: 229 10*3/uL (ref 150.0–400.0)
RBC: 4.81 Mil/uL (ref 3.87–5.11)
RDW: 13.7 % (ref 11.5–15.5)
WBC: 6.8 10*3/uL (ref 4.0–10.5)

## 2015-04-15 LAB — POCT URINALYSIS DIPSTICK
Bilirubin, UA: NEGATIVE
Blood, UA: NEGATIVE
GLUCOSE UA: NEGATIVE
Ketones, UA: NEGATIVE
LEUKOCYTES UA: NEGATIVE
NITRITE UA: NEGATIVE
Protein, UA: NEGATIVE
Spec Grav, UA: 1.01
UROBILINOGEN UA: 0.2
pH, UA: 7

## 2015-04-15 LAB — LIPID PANEL
CHOLESTEROL: 153 mg/dL (ref 0–200)
HDL: 39 mg/dL — AB (ref >39.00)
LDL CALC: 86 mg/dL (ref 0–99)
NonHDL: 113.68
TRIGLYCERIDES: 137 mg/dL (ref 0.0–149.0)
Total CHOL/HDL Ratio: 4
VLDL: 27.4 mg/dL (ref 0.0–40.0)

## 2015-04-15 LAB — HEPATIC FUNCTION PANEL
ALBUMIN: 4.2 g/dL (ref 3.5–5.2)
ALT: 32 U/L (ref 0–35)
AST: 28 U/L (ref 0–37)
Alkaline Phosphatase: 64 U/L (ref 39–117)
Bilirubin, Direct: 0.1 mg/dL (ref 0.0–0.3)
TOTAL PROTEIN: 7 g/dL (ref 6.0–8.3)
Total Bilirubin: 0.5 mg/dL (ref 0.2–1.2)

## 2015-04-15 LAB — BASIC METABOLIC PANEL
BUN: 11 mg/dL (ref 6–23)
CHLORIDE: 101 meq/L (ref 96–112)
CO2: 31 meq/L (ref 19–32)
Calcium: 9.4 mg/dL (ref 8.4–10.5)
Creatinine, Ser: 0.77 mg/dL (ref 0.40–1.20)
GFR: 80.66 mL/min (ref >60.00)
GLUCOSE: 91 mg/dL (ref 70–99)
Potassium: 3.9 mEq/L (ref 3.5–5.1)
SODIUM: 140 meq/L (ref 135–145)

## 2015-04-15 LAB — TSH: TSH: 2.85 u[IU]/mL (ref 0.35–4.50)

## 2015-04-29 ENCOUNTER — Encounter: Payer: Self-pay | Admitting: Family Medicine

## 2015-04-29 ENCOUNTER — Ambulatory Visit (INDEPENDENT_AMBULATORY_CARE_PROVIDER_SITE_OTHER): Payer: BLUE CROSS/BLUE SHIELD | Admitting: Family Medicine

## 2015-04-29 VITALS — BP 107/69 | HR 66 | Temp 98.7°F | Ht 63.0 in | Wt 156.0 lb

## 2015-04-29 DIAGNOSIS — Z0000 Encounter for general adult medical examination without abnormal findings: Secondary | ICD-10-CM | POA: Diagnosis not present

## 2015-04-29 MED ORDER — LOSARTAN POTASSIUM-HCTZ 100-25 MG PO TABS: 1.0000 | ORAL_TABLET | Freq: Every day | ORAL | Status: DC

## 2015-04-29 MED ORDER — POTASSIUM CHLORIDE ER 10 MEQ PO TBCR: 10.0000 meq | EXTENDED_RELEASE_TABLET | Freq: Every day | ORAL | Status: DC

## 2015-04-29 MED ORDER — AMLODIPINE BESYLATE 5 MG PO TABS: 5.0000 mg | ORAL_TABLET | Freq: Every day | ORAL | Status: DC

## 2015-04-29 NOTE — Progress Notes (Signed)
Pre visit review using our clinic review tool, if applicable. No additional management support is needed unless otherwise documented below in the visit note. 

## 2015-04-29 NOTE — Progress Notes (Signed)
   Subjective:    Patient ID: Kelly Holder, female    DOB: 12-05-1950, 64 y.o.   MRN: [id number]  HPI 64 yr old female for a cpx. She feels well and has no concerns.    Review of Systems  Constitutional: Negative.   HENT: Negative.   Eyes: Negative.   Respiratory: Negative.   Cardiovascular: Negative.   Gastrointestinal: Negative.   Genitourinary: Negative for dysuria, urgency, frequency, hematuria, flank pain, decreased urine volume, enuresis, difficulty urinating, pelvic pain and dyspareunia.  Musculoskeletal: Negative.   Skin: Negative.   Neurological: Negative.   Psychiatric/Behavioral: Negative.        Objective:   Physical Exam  Constitutional: She is oriented to person, place, and time. She appears well-developed and well-nourished. No distress.  HENT:  Head: Normocephalic and atraumatic.  Right Ear: External ear normal.  Left Ear: External ear normal.  Nose: Nose normal.  Mouth/Throat: Oropharynx is clear and moist. No oropharyngeal exudate.  Eyes: Conjunctivae and EOM are normal. Pupils are equal, round, and reactive to light. No scleral icterus.  Neck: Normal range of motion. Neck supple. No JVD present. No thyromegaly present.  Cardiovascular: Normal rate, regular rhythm, normal heart sounds and intact distal pulses.  Exam reveals no gallop and no friction rub.   No murmur heard. EKG normal   Pulmonary/Chest: Effort normal and breath sounds normal. No respiratory distress. She has no wheezes. She has no rales. She exhibits no tenderness.  Abdominal: Soft. Bowel sounds are normal. She exhibits no distension and no mass. There is no tenderness. There is no rebound and no guarding.  Musculoskeletal: Normal range of motion. She exhibits no edema or tenderness.  Lymphadenopathy:    She has no cervical adenopathy.  Neurological: She is alert and oriented to person, place, and time. She has normal reflexes. No cranial nerve deficit. She exhibits normal muscle tone.  Coordination normal.  Skin: Skin is warm and dry. No rash noted. No erythema.  Psychiatric: She has a normal mood and affect. Her behavior is normal. Judgment and thought content normal.          Assessment & Plan:  Well exam. We discussed diet and exercise advice.

## 2015-05-23 ENCOUNTER — Other Ambulatory Visit: Payer: Self-pay | Admitting: Family Medicine

## 2015-05-23 ENCOUNTER — Encounter (INDEPENDENT_AMBULATORY_CARE_PROVIDER_SITE_OTHER): Payer: BLUE CROSS/BLUE SHIELD | Admitting: Ophthalmology

## 2015-05-23 DIAGNOSIS — H2513 Age-related nuclear cataract, bilateral: Secondary | ICD-10-CM

## 2015-05-23 DIAGNOSIS — H35033 Hypertensive retinopathy, bilateral: Secondary | ICD-10-CM | POA: Diagnosis not present

## 2015-05-23 DIAGNOSIS — H43813 Vitreous degeneration, bilateral: Secondary | ICD-10-CM | POA: Diagnosis not present

## 2015-05-23 DIAGNOSIS — H35372 Puckering of macula, left eye: Secondary | ICD-10-CM

## 2015-05-23 DIAGNOSIS — I10 Essential (primary) hypertension: Secondary | ICD-10-CM | POA: Diagnosis not present

## 2015-05-23 DIAGNOSIS — D3132 Benign neoplasm of left choroid: Secondary | ICD-10-CM

## 2016-01-22 ENCOUNTER — Ambulatory Visit (INDEPENDENT_AMBULATORY_CARE_PROVIDER_SITE_OTHER): Payer: BLUE CROSS/BLUE SHIELD | Admitting: Family Medicine

## 2016-01-22 ENCOUNTER — Encounter: Payer: Self-pay | Admitting: Family Medicine

## 2016-01-22 VITALS — BP 108/75 | HR 70 | Temp 98.6°F | Ht 63.0 in | Wt 160.0 lb

## 2016-01-22 DIAGNOSIS — R040 Epistaxis: Secondary | ICD-10-CM | POA: Diagnosis not present

## 2016-01-22 DIAGNOSIS — I10 Essential (primary) hypertension: Secondary | ICD-10-CM | POA: Diagnosis not present

## 2016-01-22 DIAGNOSIS — M25512 Pain in left shoulder: Secondary | ICD-10-CM | POA: Diagnosis not present

## 2016-01-22 NOTE — Progress Notes (Signed)
   Subjective:    Patient ID: Kelly Holder, female    DOB: Sep 30, 1950, 65 y.o.   MRN: [id number]  HPI Here for 2 things. First she has had 2 nosebleeds in the past 6 weeks. She had one 6 weeks ago and another one 3 weeks ago. No bleeding since then. No hx of trauma. Both bleeds were out of the left nostril. No other bruising or bleeding issues. Also she has had left shoulder pain for about 2 months. She works out and [employer] and would like advice.    Review of Systems  Constitutional: Negative.   HENT: Positive for nosebleeds. Negative for sinus pressure and sore throat.   Respiratory: Negative.   Cardiovascular: Negative.   Musculoskeletal: Positive for arthralgias.  Neurological: Negative.   Hematological: Does not bruise/bleed easily.       Objective:   Physical Exam  Constitutional: She is oriented to person, place, and time. She appears well-developed and well-nourished.  HENT:  Nose: Nose normal.  No source of bleeding is seen   Neck: No thyromegaly present.  Cardiovascular: Normal rate, regular rhythm, normal heart sounds and intact distal pulses.   Pulmonary/Chest: Effort normal and breath sounds normal.  Musculoskeletal:  Tender in the anterior left shoulder, full ROM  Lymphadenopathy:    She has no cervical adenopathy.  Neurological: She is alert and oriented to person, place, and time.  Skin:  No visible bruising           Assessment & Plan:  She has had 2 isolated nosebleeds which seem to have resolved. She will return of these start up again. Her HTN is stable. Refer to Sports Medicine for the shoulder pain.

## 2016-01-22 NOTE — Progress Notes (Signed)
Pre visit review using our clinic review tool, if applicable. No additional management support is needed unless otherwise documented below in the visit note. 

## 2016-02-13 ENCOUNTER — Ambulatory Visit: Payer: BLUE CROSS/BLUE SHIELD | Admitting: Family Medicine

## 2016-02-24 NOTE — Progress Notes (Signed)
[name] [address] [place], [place] [address] Phone: [phone] Subjective:    I'm seeing this patient by the request  of:  FRY,STEPHEN A, MD   CC: left shoulder pain  [id number]  Kelly Holder is a 65 y.o. female coming in with complaint of left shoulder pain. One month ago did see primary care provider. Patient states been going on for approximately 3 months now. Patient does work out fairly religiously. Does like lifting weights. Patient has had difficult secondary to discomfort. Given her pain at night. No radiation, no numbness. Denies any weakness. Just more of a hindrance anything. Patient denies any true injury. Rates the severity of pain is 4 out of 10 and seems to be increasing in frequency as well as severity.    Past Medical History:  Diagnosis Date  . Cataract    early stage  . Endometriosis   . Hypertension   . Migraines   . Mitral valve prolapse    Past Surgical History:  Procedure Laterality Date  . ABDOMINAL HYSTERECTOMY  1992   TAH and BSO   . BREAST SURGERY  1993   biopsy, benign   . COLONOSCOPY  06-15-14   per Dr. Carlean Purl, benign polyps, repeat in 10 yrs   . MENISCUS REPAIR     Social History   Social History  . Marital status: Single    Spouse name: N/A  . Number of children: N/A  . Years of education: N/A   Social History Main Topics  . Smoking status: Never Smoker  . Smokeless tobacco: Never Used  . Alcohol use 0.0 oz/week     Comment: glass of wine most nights  . Drug use: No  . Sexual activity: Not Asked   Other Topics Concern  . None   Social History Narrative  . None   Allergies  Allergen Reactions  . Sulfonamide Derivatives    Family History  Problem Relation Age of Onset  . Hypertension Mother   . Hypertension Father   . Melanoma Father   . Melanoma Brother   . Colon cancer Maternal Aunt     Past medical history, social, surgical and family history all reviewed in electronic  medical record.  No pertanent information unless stated regarding to the chief complaint.   Review of Systems: No headache, visual changes, nausea, vomiting, diarrhea, constipation, dizziness, abdominal pain, skin rash, fevers, chills, night sweats, weight loss, swollen lymph nodes, body aches, joint swelling, muscle aches, chest pain, shortness of breath, mood changes.   Objective  Blood pressure 136/78, pulse 74, height 5\' 3"  (1.6 m), weight 160 lb (72.6 kg), SpO2 97 %.  General: No apparent distress alert and oriented x3 mood and affect normal, dressed appropriately.  HEENT: Pupils equal, extraocular movements intact  Respiratory: Patient's speak in full sentences and does not appear short of breath  Cardiovascular: No lower extremity edema, non tender, no erythema  Skin: Warm dry intact with no signs of infection or rash on extremities or on axial skeleton.  Abdomen: Soft nontender  Neuro: Cranial nerves II through XII are intact, neurovascularly intact in all extremities with 2+ DTRs and 2+ pulses.  Lymph: No lymphadenopathy of posterior or anterior cervical chain or axillae bilaterally.  Gait normal with good balance and coordination.  MSK:  Non tender with full range of motion and good stability and symmetric strength and tone of  elbows, wrist, hip, knee and ankles bilaterally.  Shoulder: left Inspection reveals no abnormalities,  atrophy or asymmetry. Palpation is normal with no tenderness over AC joint or bicipital groove. ROM is full in all planes passively. Rotator cuff strength normal throughout. signs of impingement with positive Neer and Hawkin's tests, but negative empty can sign. Speeds and Yergason's tests normal. No labral pathology noted with negative Obrien's, negative clunk and good stability. Normal scapular function observed. No painful arc and no drop arm sign. No apprehension sign  MSK US performed of: left This study was ordered, performed, and interpreted by  Charlann Boxer D.O.  Shoulder:   Supraspinatus:  Appears normal on long and transverse views does have significant hypoechoic changes,. Infraspinatus:  Appears normal on long and transverse views. Significant increase in Doppler flow Subscapularis:  Appears normal on long and transverse views. Positive bursa Teres Minor:  Appears normal on long and transverse views. AC joint:  Minimal arthritic changes noted Glenohumeral Joint:  Appears normal without effusion. Glenoid Labrum:  Intact without visualized tears. Biceps Tendon:  Appears normal on long and transverse views, no fraying of tendon, tendon located in intertubercular groove, no subluxation with shoulder internal or external rotation.  Impression: tendinitis  Procedure: Real-time Ultrasound Guided Injection of left glenohumeral joint Device: GE Logiq E  Ultrasound guided injection is preferred based studies that show increased duration, increased effect, greater accuracy, decreased procedural pain, increased response rate with ultrasound guided versus blind injection.  Verbal informed consent obtained.  Time-out conducted.  Noted no overlying erythema, induration, or other signs of local infection.  Skin prepped in a sterile fashion.  Local anesthesia: Topical Ethyl chloride.  With sterile technique and under real time ultrasound guidance:  Joint visualized.  23g 1  inch needle inserted posterior approach. Pictures taken for needle placement. Patient did have injection of 2 cc of 1% lidocaine, 2 cc of 0.5% Marcaine, and 1.0 cc of Kenalog 40 mg/dL. Completed without difficulty  Pain immediately resolved suggesting accurate placement of the medication.  Advised to call if fevers/chills, erythema, induration, drainage, or persistent bleeding.  Images permanently stored and available for review in the ultrasound unit.  Impression: Technically successful ultrasound guided injection.     Impression and Recommendations:     This case  required medical decision making of moderate complexity.      Note: This dictation was prepared with Dragon dictation along with smaller phrase technology. Any transcriptional errors that result from this process are unintentional.

## 2016-02-25 ENCOUNTER — Other Ambulatory Visit: Payer: Self-pay

## 2016-02-25 ENCOUNTER — Ambulatory Visit (INDEPENDENT_AMBULATORY_CARE_PROVIDER_SITE_OTHER): Payer: BLUE CROSS/BLUE SHIELD | Admitting: Family Medicine

## 2016-02-25 ENCOUNTER — Encounter: Payer: Self-pay | Admitting: Family Medicine

## 2016-02-25 VITALS — BP 136/78 | HR 74 | Ht 63.0 in | Wt 160.0 lb

## 2016-02-25 DIAGNOSIS — M7582 Other shoulder lesions, left shoulder: Secondary | ICD-10-CM | POA: Insufficient documentation

## 2016-02-25 DIAGNOSIS — M25512 Pain in left shoulder: Secondary | ICD-10-CM

## 2016-02-25 MED ORDER — DICLOFENAC SODIUM 2 % TD SOLN: 2.0000 "application " | Freq: Two times a day (BID) | TRANSDERMAL | 3 refills | Status: DC

## 2016-02-25 NOTE — Assessment & Plan Note (Signed)
HEP,  Patient also given topical anti-inflammatory's prescribed. We discussed over-the-counter medications. Discussed which activities to do an which was to avoid. We discussed icing regimen. Patient will follow-up with me again in 4 weeks

## 2016-02-25 NOTE — Patient Instructions (Signed)
Good to see you.  Ice 20 minutes 2 times daily. Usually after activity and before bed. Exercises 3 times a week.  On wall with heels, butt shoulder and head touching for a goal of 5 minutes daily  Tennis ball between shoulder blades with sitting at computer OK to lift but 50% of what you were doing and increase 10% a week.  pennsaid pinkie amount topically 2 times daily as needed.  Vitmain D 2000 IU dialy  Turmeric 500mg  twice daily  Vitamin C 500mg  daily with large meals can help.  Consider Iron 65mg  at least 3 times a week See me again in 4 weeks

## 2016-03-26 NOTE — Progress Notes (Signed)
[facility] [address] [place], [place] [address] Phone: [phone] Subjective:    I'm seeing this patient by the request  of:  FRY,STEPHEN A, MD   CC: left shoulder pain Follow-up  [id number]  Kelly Holder is a 65 y.o. female coming in with complaint of left shoulder pain. Found to have more of a left rotator cuff tendinitis. Patient was given an injection as well as home exercises, icing protocol, as well as topical anti-inflammatories. Patient states Doing 90% better. Minimal pain with certain movements. Patient states that overall she seems to be resting comfortable. Topical anti-inflammatories haven't been very helpful. Has only been intermittently icing and doing home exercises. No new symptoms.    Past Medical History:  Diagnosis Date  . Cataract    early stage  . Endometriosis   . Hypertension   . Migraines   . Mitral valve prolapse    Past Surgical History:  Procedure Laterality Date  . ABDOMINAL HYSTERECTOMY  1992   TAH and BSO   . BREAST SURGERY  1993   biopsy, benign   . COLONOSCOPY  06-15-14   per Dr. Carlean Purl, benign polyps, repeat in 10 yrs   . MENISCUS REPAIR     Social History   Social History  . Marital status: Single    Spouse name: N/A  . Number of children: N/A  . Years of education: N/A   Social History Main Topics  . Smoking status: Never Smoker  . Smokeless tobacco: Never Used  . Alcohol use 0.0 oz/week     Comment: glass of wine most nights  . Drug use: No  . Sexual activity: Not on file   Other Topics Concern  . Not on file   Social History Narrative  . No narrative on file   Allergies  Allergen Reactions  . Sulfonamide Derivatives    Family History  Problem Relation Age of Onset  . Hypertension Mother   . Hypertension Father   . Melanoma Father   . Melanoma Brother   . Colon cancer Maternal Aunt     Past medical history, social, surgical and family history all reviewed in electronic  medical record.  No pertanent information unless stated regarding to the chief complaint.   Review of Systems: No headache, visual changes, nausea, vomiting, diarrhea, constipation, dizziness, abdominal pain, skin rash, fevers, chills, night sweats, weight loss, swollen lymph nodes, body aches, joint swelling, muscle aches, chest pain, shortness of breath, mood changes.   Objective  There were no vitals taken for this visit.  General: No apparent distress alert and oriented x3 mood and affect normal, dressed appropriately.  HEENT: Pupils equal, extraocular movements intact  Respiratory: Patient's speak in full sentences and does not appear short of breath  Cardiovascular: No lower extremity edema, non tender, no erythema  Skin: Warm dry intact with no signs of infection or rash on extremities or on axial skeleton.  Abdomen: Soft nontender  Neuro: Cranial nerves II through XII are intact, neurovascularly intact in all extremities with 2+ DTRs and 2+ pulses.  Lymph: No lymphadenopathy of posterior or anterior cervical chain or axillae bilaterally.  Gait normal with good balance and coordination.  MSK:  Non tender with full range of motion and good stability and symmetric strength and tone of  elbows, wrist, hip, knee and ankles bilaterally.  Shoulder: left Inspection reveals no abnormalities, atrophy or asymmetry. Palpation is normal with no tenderness over AC joint or bicipital groove. ROM is  full in all planes passively. Rotator cuff strength normal throughout. Minimal impingement sign still remaining Speeds and Yergason's tests normal. No labral pathology noted with negative Obrien's, negative clunk and good stability. Normal scapular function observed. No painful arc and no drop arm sign. No apprehension sign    Impression and Recommendations:     This case required medical decision making of moderate complexity.      Note: This dictation was prepared with Dragon dictation  along with smaller phrase technology. Any transcriptional errors that result from this process are unintentional.

## 2016-03-27 ENCOUNTER — Ambulatory Visit (INDEPENDENT_AMBULATORY_CARE_PROVIDER_SITE_OTHER): Payer: BLUE CROSS/BLUE SHIELD | Admitting: Family Medicine

## 2016-03-27 ENCOUNTER — Encounter: Payer: Self-pay | Admitting: Family Medicine

## 2016-03-27 DIAGNOSIS — M7582 Other shoulder lesions, left shoulder: Secondary | ICD-10-CM

## 2016-03-27 NOTE — Assessment & Plan Note (Signed)
Doing very well with conservative therapy. No change in management. If worsening symptoms and consider injection. Otherwise patient will follow-up as needed.

## 2016-03-27 NOTE — Patient Instructions (Signed)
Good to see you  Ice is your friend Try to do the exercises 2-3 times a week for another 6 week Continue the vitamins See me again in 6 weeks if not happy with the progress.

## 2016-04-28 ENCOUNTER — Other Ambulatory Visit: Payer: Self-pay | Admitting: Family Medicine

## 2016-04-28 MED ORDER — LOSARTAN POTASSIUM-HCTZ 100-25 MG PO TABS: 1.0000 | ORAL_TABLET | Freq: Every day | ORAL | 0 refills | Status: DC

## 2016-04-28 MED ORDER — AMLODIPINE BESYLATE 5 MG PO TABS: 5.0000 mg | ORAL_TABLET | Freq: Every day | ORAL | 0 refills | Status: DC

## 2016-04-28 MED ORDER — POTASSIUM CHLORIDE ER 10 MEQ PO TBCR: 10.0000 meq | EXTENDED_RELEASE_TABLET | Freq: Every day | ORAL | 0 refills | Status: DC

## 2016-04-28 NOTE — Telephone Encounter (Signed)
Rx's sent to pharmacy for 30 days.

## 2016-04-28 NOTE — Telephone Encounter (Signed)
Pt need 30 day supply of each medications until her mail order arrives. Amlodipine, losartan-hctz,and klor-con

## 2016-05-04 ENCOUNTER — Other Ambulatory Visit: Payer: BLUE CROSS/BLUE SHIELD

## 2016-05-04 ENCOUNTER — Other Ambulatory Visit (INDEPENDENT_AMBULATORY_CARE_PROVIDER_SITE_OTHER): Payer: BLUE CROSS/BLUE SHIELD

## 2016-05-04 DIAGNOSIS — Z0000 Encounter for general adult medical examination without abnormal findings: Secondary | ICD-10-CM | POA: Diagnosis not present

## 2016-05-04 LAB — LIPID PANEL
CHOLESTEROL: 149 mg/dL (ref 0–200)
HDL: 35.5 mg/dL — AB (ref >39.00)
LDL Cholesterol: 83 mg/dL (ref 0–99)
NonHDL: 113.18
TRIGLYCERIDES: 153 mg/dL — AB (ref 0.0–149.0)
Total CHOL/HDL Ratio: 4
VLDL: 30.6 mg/dL (ref 0.0–40.0)

## 2016-05-04 LAB — POC URINALSYSI DIPSTICK (AUTOMATED)
BILIRUBIN UA: NEGATIVE
Blood, UA: NEGATIVE
GLUCOSE UA: NEGATIVE
Ketones, UA: NEGATIVE
LEUKOCYTES UA: NEGATIVE
NITRITE UA: NEGATIVE
Protein, UA: NEGATIVE
Spec Grav, UA: 1.01
UROBILINOGEN UA: 0.2
pH, UA: 7

## 2016-05-04 LAB — CBC WITH DIFFERENTIAL/PLATELET
BASOS ABS: 0 10*3/uL (ref 0.0–0.1)
BASOS PCT: 0.5 % (ref 0.0–3.0)
EOS PCT: 2.6 % (ref 0.0–5.0)
Eosinophils Absolute: 0.2 10*3/uL (ref 0.0–0.7)
HEMATOCRIT: 41.2 % (ref 36.0–46.0)
Hemoglobin: 14 g/dL (ref 12.0–15.0)
LYMPHS ABS: 2.2 10*3/uL (ref 0.7–4.0)
Lymphocytes Relative: 29.1 % (ref 12.0–46.0)
MCHC: 33.9 g/dL (ref 30.0–36.0)
MCV: 84.9 fl (ref 78.0–100.0)
MONOS PCT: 8 % (ref 3.0–12.0)
Monocytes Absolute: 0.6 10*3/uL (ref 0.1–1.0)
NEUTROS ABS: 4.4 10*3/uL (ref 1.4–7.7)
NEUTROS PCT: 59.8 % (ref 43.0–77.0)
PLATELETS: 234 10*3/uL (ref 150.0–400.0)
RBC: 4.85 Mil/uL (ref 3.87–5.11)
RDW: 13.6 % (ref 11.5–15.5)
WBC: 7.4 10*3/uL (ref 4.0–10.5)

## 2016-05-04 LAB — HEPATIC FUNCTION PANEL
ALBUMIN: 4 g/dL (ref 3.5–5.2)
ALT: 25 U/L (ref 0–35)
AST: 26 U/L (ref 0–37)
Alkaline Phosphatase: 63 U/L (ref 39–117)
Bilirubin, Direct: 0.1 mg/dL (ref 0.0–0.3)
TOTAL PROTEIN: 6.8 g/dL (ref 6.0–8.3)
Total Bilirubin: 0.5 mg/dL (ref 0.2–1.2)

## 2016-05-04 LAB — BASIC METABOLIC PANEL
BUN: 11 mg/dL (ref 6–23)
CALCIUM: 9.2 mg/dL (ref 8.4–10.5)
CO2: 31 meq/L (ref 19–32)
CREATININE: 0.83 mg/dL (ref 0.40–1.20)
Chloride: 101 mEq/L (ref 96–112)
GFR: 73.72 mL/min (ref >60.00)
GLUCOSE: 94 mg/dL (ref 70–99)
Potassium: 3.6 mEq/L (ref 3.5–5.1)
Sodium: 139 mEq/L (ref 135–145)

## 2016-05-04 LAB — TSH: TSH: 3.14 u[IU]/mL (ref 0.35–4.50)

## 2016-05-12 ENCOUNTER — Encounter: Payer: Self-pay | Admitting: Family Medicine

## 2016-05-12 ENCOUNTER — Ambulatory Visit (INDEPENDENT_AMBULATORY_CARE_PROVIDER_SITE_OTHER): Payer: BLUE CROSS/BLUE SHIELD | Admitting: Family Medicine

## 2016-05-12 VITALS — BP 104/62 | HR 69 | Temp 98.5°F | Ht 63.0 in | Wt 158.0 lb

## 2016-05-12 DIAGNOSIS — Z0000 Encounter for general adult medical examination without abnormal findings: Secondary | ICD-10-CM

## 2016-05-12 MED ORDER — AMLODIPINE BESYLATE 5 MG PO TABS: 5.0000 mg | ORAL_TABLET | Freq: Every day | ORAL | 3 refills | Status: DC

## 2016-05-12 MED ORDER — LOSARTAN POTASSIUM-HCTZ 100-25 MG PO TABS: 1.0000 | ORAL_TABLET | Freq: Every day | ORAL | 3 refills | Status: DC

## 2016-05-12 MED ORDER — POTASSIUM CHLORIDE ER 10 MEQ PO TBCR: 10.0000 meq | EXTENDED_RELEASE_TABLET | Freq: Every day | ORAL | 3 refills | Status: DC

## 2016-05-12 NOTE — Progress Notes (Signed)
   Subjective:    Patient ID: Kelly Holder, female    DOB: 1950/12/19, 65 y.o.   MRN: [id number]  HPI 65 yr old female for a well exam. She feels well.    Review of Systems  Constitutional: Negative.   HENT: Negative.   Eyes: Negative.   Respiratory: Negative.   Cardiovascular: Negative.   Gastrointestinal: Negative.   Genitourinary: Negative for decreased urine volume, difficulty urinating, dyspareunia, dysuria, enuresis, flank pain, frequency, hematuria, pelvic pain and urgency.  Musculoskeletal: Negative.   Skin: Negative.   Neurological: Negative.   Psychiatric/Behavioral: Negative.        Objective:   Physical Exam  Constitutional: She is oriented to person, place, and time. She appears well-developed and well-nourished. No distress.  HENT:  Head: Normocephalic and atraumatic.  Right Ear: External ear normal.  Left Ear: External ear normal.  Nose: Nose normal.  Mouth/Throat: Oropharynx is clear and moist. No oropharyngeal exudate.  Eyes: Conjunctivae and EOM are normal. Pupils are equal, round, and reactive to light. No scleral icterus.  Neck: Normal range of motion. Neck supple. No JVD present. No thyromegaly present.  Cardiovascular: Normal rate, regular rhythm, normal heart sounds and intact distal pulses.  Exam reveals no gallop and no friction rub.   No murmur heard. EKG is normal   Pulmonary/Chest: Effort normal and breath sounds normal. No respiratory distress. She has no wheezes. She has no rales. She exhibits no tenderness.  Abdominal: Soft. Bowel sounds are normal. She exhibits no distension and no mass. There is no tenderness. There is no rebound and no guarding.  Musculoskeletal: Normal range of motion. She exhibits no edema or tenderness.  Lymphadenopathy:    She has no cervical adenopathy.  Neurological: She is alert and oriented to person, place, and time. She has normal reflexes. No cranial nerve deficit. She exhibits normal muscle tone. Coordination  normal.  Skin: Skin is warm and dry. No rash noted. No erythema.  Psychiatric: She has a normal mood and affect. Her behavior is normal. Judgment and thought content normal.          Assessment & Plan:  Well exam. We discussed diet and exercise.  Laurey Morale, MD

## 2016-05-12 NOTE — Progress Notes (Signed)
Pre visit review using our clinic review tool, if applicable. No additional management support is needed unless otherwise documented below in the visit note. 

## 2016-05-15 ENCOUNTER — Ambulatory Visit: Payer: BLUE CROSS/BLUE SHIELD | Admitting: Family Medicine

## 2016-06-18 DIAGNOSIS — L578 Other skin changes due to chronic exposure to nonionizing radiation: Secondary | ICD-10-CM | POA: Diagnosis not present

## 2016-06-18 DIAGNOSIS — D225 Melanocytic nevi of trunk: Secondary | ICD-10-CM | POA: Diagnosis not present

## 2016-08-25 ENCOUNTER — Other Ambulatory Visit: Payer: Self-pay | Admitting: Family Medicine

## 2016-08-25 DIAGNOSIS — Z1231 Encounter for screening mammogram for malignant neoplasm of breast: Secondary | ICD-10-CM

## 2016-09-15 ENCOUNTER — Ambulatory Visit
Admission: RE | Admit: 2016-09-15 | Discharge: 2016-09-15 | Disposition: A | Discharge status: Home / Self Care | Payer: BLUE CROSS/BLUE SHIELD | Source: Ambulatory Visit | Attending: Family Medicine | Admitting: Family Medicine

## 2016-09-15 DIAGNOSIS — Z1231 Encounter for screening mammogram for malignant neoplasm of breast: Secondary | ICD-10-CM | POA: Diagnosis not present

## 2016-10-23 DIAGNOSIS — H4312 Vitreous hemorrhage, left eye: Secondary | ICD-10-CM | POA: Diagnosis not present

## 2016-10-29 ENCOUNTER — Encounter (INDEPENDENT_AMBULATORY_CARE_PROVIDER_SITE_OTHER): Payer: BLUE CROSS/BLUE SHIELD | Admitting: Ophthalmology

## 2016-10-29 DIAGNOSIS — I10 Essential (primary) hypertension: Secondary | ICD-10-CM

## 2016-10-29 DIAGNOSIS — H35033 Hypertensive retinopathy, bilateral: Secondary | ICD-10-CM | POA: Diagnosis not present

## 2016-10-29 DIAGNOSIS — D3132 Benign neoplasm of left choroid: Secondary | ICD-10-CM | POA: Diagnosis not present

## 2016-10-29 DIAGNOSIS — H33301 Unspecified retinal break, right eye: Secondary | ICD-10-CM | POA: Diagnosis not present

## 2016-11-16 NOTE — Progress Notes (Signed)
[facility] [address] [place], [place] [address] Phone: [phone] Subjective:    I'm seeing this patient by the request  of:    CC: Left shoulder follow-up.  [id number]  [name] is a 66 y.o. female coming in with complaint of left shoulder pain. Found to have more of a tendinitis of the left shoulder. Last seen 8 months ago. Patient was to do conservative therapy. Patient states A little bit different. Has some decreasing range of motion. Still having pain patient does not remember any true injury. Patient discusses the pain as a dull, throbbing aching pain that has a sharp pain with certain movements. Affecting some daily activities none nighttime. Waking her up at night. Mild radiation down the arm but only.      Past Medical History:  Diagnosis Date  . Cataract    early stage  . Endometriosis   . Hypertension   . Migraines   . Mitral valve prolapse    Past Surgical History:  Procedure Laterality Date  . ABDOMINAL HYSTERECTOMY  1992   TAH and BSO   . BREAST SURGERY  1993   biopsy, benign   . COLONOSCOPY  06-15-14   per Dr. Carlean Purl, benign polyps, repeat in 10 yrs   . MENISCUS REPAIR     Social History   Social History  . Marital status: Single    Spouse name: N/A  . Number of children: N/A  . Years of education: N/A   Social History Main Topics  . Smoking status: Never Smoker  . Smokeless tobacco: Never Used  . Alcohol use 0.0 oz/week     Comment: glass of wine most nights  . Drug use: No  . Sexual activity: Not Asked   Other Topics Concern  . None   Social History Narrative  . None   Allergies  Allergen Reactions  . Sulfonamide Derivatives    Family History  Problem Relation Age of Onset  . Hypertension Mother   . Hypertension Father   . Melanoma Father   . Melanoma Brother   . Colon cancer Maternal Aunt   . Breast cancer Maternal Aunt   . Breast cancer Maternal Grandmother     Past medical history,  social, surgical and family history all reviewed in electronic medical record.  No pertanent information unless stated regarding to the chief complaint.   Review of Systems:Review of systems updated and as accurate as of 11/17/16  No headache, visual changes, nausea, vomiting, diarrhea, constipation, dizziness, abdominal pain, skin rash, fevers, chills, night sweats, weight loss, swollen lymph nodes, body aches, joint swelling, muscle aches, chest pain, shortness of breath, mood changes.   Objective  Blood pressure 112/76, pulse 63, resp. rate 16, weight 156 lb (70.8 kg), SpO2 95 %. Systems examined below as of 11/17/16   General: No apparent distress alert and oriented x3 mood and affect normal, dressed appropriately.  HEENT: Pupils equal, extraocular movements intact  Respiratory: Patient's speak in full sentences and does not appear short of breath  Cardiovascular: No lower extremity edema, non tender, no erythema  Skin: Warm dry intact with no signs of infection or rash on extremities or on axial skeleton.  Abdomen: Soft nontender  Neuro: Cranial nerves II through XII are intact, neurovascularly intact in all extremities with 2+ DTRs and 2+ pulses.  Lymph: No lymphadenopathy of posterior or anterior cervical chain or axillae bilaterally.  Gait normal with good balance and coordination.  MSK:  Non tender  with full range of motion and good stability and symmetric strength and tone of  elbows, wrist, hip, knee and ankles bilaterally.   Shoulder: Left Inspection reveals no abnormalities, atrophy or asymmetry. Palpation is normal with no tenderness over AC joint or bicipital groove. Decreased range of motion lacking the last 5 of flexion as well as the last 20 of external rotation Rotator cuff strength 4-5 compared to the contralateral side No signs of impingement with negative Neer and Hawkin's tests, empty can sign. Speeds and Yergason's tests normal. Positive labral pathology Normal  scapular function observed. No painful arc and no drop arm sign. No apprehension sign Contralateral shoulder unremarkable   Procedure note 97110; 15 minutes spent for Therapeutic exercises as stated in above notes.  This included exercises focusing on stretching, strengthening, with significant focus on eccentric aspects.Shoulder Exercises that included:  Basic scapular stabilization to include adduction and depression of scapula Scaption, focusing on proper movement and good control Internal and External rotation utilizing a theraband, with elbow tucked at side entire time Rows with theraband     Proper technique shown and discussed handout in great detail with ATC.  All questions were discussed and answered.        Impression and Recommendations:     This case required medical decision making of moderate complexity.      Note: This dictation was prepared with Dragon dictation along with smaller phrase technology. Any transcriptional errors that result from this process are unintentional.

## 2016-11-17 ENCOUNTER — Ambulatory Visit (INDEPENDENT_AMBULATORY_CARE_PROVIDER_SITE_OTHER): Payer: BLUE CROSS/BLUE SHIELD | Admitting: Family Medicine

## 2016-11-17 ENCOUNTER — Encounter: Payer: Self-pay | Admitting: Family Medicine

## 2016-11-17 ENCOUNTER — Ambulatory Visit: Payer: Self-pay

## 2016-11-17 VITALS — BP 112/76 | HR 63 | Resp 16 | Wt 156.0 lb

## 2016-11-17 DIAGNOSIS — M25519 Pain in unspecified shoulder: Secondary | ICD-10-CM

## 2016-11-17 DIAGNOSIS — M7502 Adhesive capsulitis of left shoulder: Secondary | ICD-10-CM | POA: Diagnosis not present

## 2016-11-17 MED ORDER — VITAMIN D (ERGOCALCIFEROL) 1.25 MG (50000 UNIT) PO CAPS: 50000.0000 [IU] | ORAL_CAPSULE | ORAL | 0 refills | Status: DC

## 2016-11-17 NOTE — Patient Instructions (Signed)
Good to see you.   Ice 20 minutes 2 times daily. Usually after activity and before bed. Exercises 3 times a week.  Once weekly vitamin D for 12 weeks.  Consider turmeric 500mg  daily may help with inflammation  See me again 4 weeks.

## 2016-11-17 NOTE — Assessment & Plan Note (Signed)
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 Discussed with patient at great length. We discussed the differential includes potential labral pathology. Patient declined any type of injection. Started on once weekly vitamin D. Home exercises given. Patient work with Product/process development scientist and given theraband  Patient come back in 4 weeks. Worsening symptoms consider physical therapy as well as injection. Possible need for advance imaging later.

## 2016-11-17 NOTE — Progress Notes (Signed)
Pre-visit discussion using our clinic review tool. No additional management support is needed unless otherwise documented below in the visit note.  

## 2016-11-18 ENCOUNTER — Ambulatory Visit (INDEPENDENT_AMBULATORY_CARE_PROVIDER_SITE_OTHER): Payer: BLUE CROSS/BLUE SHIELD | Admitting: Ophthalmology

## 2016-11-18 DIAGNOSIS — H33301 Unspecified retinal break, right eye: Secondary | ICD-10-CM | POA: Diagnosis not present

## 2016-11-18 DIAGNOSIS — I10 Essential (primary) hypertension: Secondary | ICD-10-CM | POA: Diagnosis not present

## 2016-11-18 DIAGNOSIS — D3132 Benign neoplasm of left choroid: Secondary | ICD-10-CM

## 2016-11-18 DIAGNOSIS — H35033 Hypertensive retinopathy, bilateral: Secondary | ICD-10-CM | POA: Diagnosis not present

## 2016-12-15 NOTE — Progress Notes (Signed)
[name] [address] [place], [place] [address] Phone: [phone] Subjective:    I'm seeing this patient by the request  of:    CC: Left shoulder follow-up.  [id number]  Kelly Holder is a 66 y.o. female coming in with complaint of left shoulder pain. Found to have more of a tendinitis of the left shoulder. Last seen 8 months ago. Patient was to do conservative therapy. Patient was making some progress but was having decreasing range of motion. Was found to have more of an adhesive capsulitis of the shoulder. Patient still wanted to do the conservative therapy. Patient started on once weekly vitamin D. Patient states Minimal improvement anything worsening. Has noticed decreasing range of motion again. Still has good strength. Waking her up at night. Sometimes a sharp pain that can radiate down the arm but only last seconds. Otherwise a dull ache at all times     Past Medical History:  Diagnosis Date  . Cataract    early stage  . Endometriosis   . Hypertension   . Migraines   . Mitral valve prolapse    Past Surgical History:  Procedure Laterality Date  . ABDOMINAL HYSTERECTOMY  1992   TAH and BSO   . BREAST SURGERY  1993   biopsy, benign   . COLONOSCOPY  06-15-14   per Dr. Carlean Purl, benign polyps, repeat in 10 yrs   . MENISCUS REPAIR     Social History   Social History  . Marital status: Single    Spouse name: N/A  . Number of children: N/A  . Years of education: N/A   Social History Main Topics  . Smoking status: Never Smoker  . Smokeless tobacco: Never Used  . Alcohol use 0.0 oz/week     Comment: glass of wine most nights  . Drug use: No  . Sexual activity: Not Asked   Other Topics Concern  . None   Social History Narrative  . None   Allergies  Allergen Reactions  . Sulfonamide Derivatives    Family History  Problem Relation Age of Onset  . Hypertension Mother   . Hypertension Father   . Melanoma Father   . Melanoma  Brother   . Colon cancer Maternal Aunt   . Breast cancer Maternal Aunt   . Breast cancer Maternal Grandmother     Past medical history, social, surgical and family history all reviewed in electronic medical record.  No pertanent information unless stated regarding to the chief complaint.   Review of Systems: No headache, visual changes, nausea, vomiting, diarrhea, constipation, dizziness, abdominal pain, skin rash, fevers, chills, night sweats, weight loss, swollen lymph nodes, body aches, joint swelling, muscle aches, chest pain, shortness of breath, mood changes.  .   Objective  Blood pressure 128/72, pulse 68, height 5\' 3"  (1.6 m), weight 155 lb (70.3 kg), SpO2 99 %.   Systems examined below as of 12/16/16 General: NAD A&O x3 mood, affect normal  HEENT: Pupils equal, extraocular movements intact no nystagmus Respiratory: not short of breath at rest or with speaking Cardiovascular: No lower extremity edema, non tender Skin: Warm dry intact with no signs of infection or rash on extremities or on axial skeleton. Abdomen: Soft nontender, no masses Neuro: Cranial nerves  intact, neurovascularly intact in all extremities with 2+ DTRs and 2+ pulses. Lymph: No lymphadenopathy appreciated today  Gait normal with good balance and coordination.  MSK: Non tender with full range of motion and good stability  and symmetric strength and tone of  elbows, wrist,  knee hips and ankles bilaterally.  .    Shoulder: Left Inspection reveals no abnormalities, atrophy or asymmetry. Palpation is normal with no tenderness over AC joint or bicipital groove. Patient does have some decreasing range of motion lacking the last 15 of external rotation. Last 5 of forward flexion. Internal rotation to the sacrum Rotator cuff strength 4-5 compared to the contralateral side no change in strength Positive impingement Speeds and Yergason's tests normal. Positive labral pathology Normal scapular function  observed. No painful arc and no drop arm sign. No apprehension sign Contralateral shoulder unremarkable   After informed written and verbal consent, patient was seated on exam table. Left shoulder was prepped with alcohol swab and utilizing posterior approach, patient's right glenohumeral space was injected with 4:1  marcaine 0.5%: Kenalog 40mg /dL. Patient tolerated the procedure well without immediate complications.      Impression and Recommendations:     This case required medical decision making of moderate complexity.      Note: This dictation was prepared with Dragon dictation along with smaller phrase technology. Any transcriptional errors that result from this process are unintentional.

## 2016-12-16 ENCOUNTER — Encounter: Payer: Self-pay | Admitting: Family Medicine

## 2016-12-16 ENCOUNTER — Ambulatory Visit (INDEPENDENT_AMBULATORY_CARE_PROVIDER_SITE_OTHER): Payer: BLUE CROSS/BLUE SHIELD | Admitting: Family Medicine

## 2016-12-16 VITALS — BP 128/72 | HR 68 | Ht 63.0 in | Wt 155.0 lb

## 2016-12-16 DIAGNOSIS — M7502 Adhesive capsulitis of left shoulder: Secondary | ICD-10-CM | POA: Diagnosis not present

## 2016-12-16 DIAGNOSIS — M25512 Pain in left shoulder: Secondary | ICD-10-CM

## 2016-12-16 NOTE — Patient Instructions (Addendum)
Good to see you  Ice is your friend still  I would consider physical therapy as well and they will be calling you  We injected the shoulder and should help  Continue the vitamin D  Keep pushing it when you can.  See me again in 4-6 weeks.

## 2016-12-16 NOTE — Assessment & Plan Note (Signed)
Worsening symptoms. Injection given today. Tolerated the procedure well. We discussed the potential for advanced imaging but I like to hold. We'll start formal physical therapy. We discussed icing regimen. Follow-up again in 4-6 weeks.

## 2016-12-30 DIAGNOSIS — M7502 Adhesive capsulitis of left shoulder: Secondary | ICD-10-CM | POA: Diagnosis not present

## 2017-01-01 DIAGNOSIS — M7502 Adhesive capsulitis of left shoulder: Secondary | ICD-10-CM | POA: Diagnosis not present

## 2017-01-06 DIAGNOSIS — M7502 Adhesive capsulitis of left shoulder: Secondary | ICD-10-CM | POA: Diagnosis not present

## 2017-01-07 DIAGNOSIS — M7502 Adhesive capsulitis of left shoulder: Secondary | ICD-10-CM | POA: Diagnosis not present

## 2017-01-11 DIAGNOSIS — M7502 Adhesive capsulitis of left shoulder: Secondary | ICD-10-CM | POA: Diagnosis not present

## 2017-01-15 DIAGNOSIS — M7502 Adhesive capsulitis of left shoulder: Secondary | ICD-10-CM | POA: Diagnosis not present

## 2017-01-18 DIAGNOSIS — M7502 Adhesive capsulitis of left shoulder: Secondary | ICD-10-CM | POA: Diagnosis not present

## 2017-01-22 DIAGNOSIS — M7502 Adhesive capsulitis of left shoulder: Secondary | ICD-10-CM | POA: Diagnosis not present

## 2017-01-25 ENCOUNTER — Telehealth: Payer: Self-pay | Admitting: Family Medicine

## 2017-01-25 NOTE — Telephone Encounter (Signed)
Pt is out of town and forgot her medication and would like to see if she can get losartan, amlodipine and potassium chloride two days worth sent to.  Pharm:  [facility]  [address], [place] [phone] (P)  Pt would like to have a call to let her know when it is called in.

## 2017-01-25 NOTE — Telephone Encounter (Signed)
I called in all 3 scripts for 2 day supply and left a voice message with this information.

## 2017-01-27 ENCOUNTER — Ambulatory Visit (INDEPENDENT_AMBULATORY_CARE_PROVIDER_SITE_OTHER): Payer: BLUE CROSS/BLUE SHIELD | Admitting: Family Medicine

## 2017-01-27 ENCOUNTER — Encounter: Payer: Self-pay | Admitting: Family Medicine

## 2017-01-27 DIAGNOSIS — M7502 Adhesive capsulitis of left shoulder: Secondary | ICD-10-CM | POA: Diagnosis not present

## 2017-01-27 NOTE — Assessment & Plan Note (Signed)
Doing better with physical therapy and status post injection. Encourage her to continue home exercises and given her time protocol to start increasing activity. Patient will follow-up with me again in 6 weeks for further evaluation.

## 2017-01-27 NOTE — Patient Instructions (Signed)
Good to see you  Ice 20 minutes 2 times daily. Usually after activity and before bed. Keep it up  PT another 2-4 weeks.  Do everything else After 4th of July ok to start lifting again but very light weights See me again in 6-8 weeks

## 2017-01-27 NOTE — Progress Notes (Signed)
[facility] [address] [place], [place] [address] Phone: [phone] Subjective:     CC: Left shoulder follow-up.  [id number]  Kelly Holder is a 66 y.o. female coming in with complaint of left shoulder pain. Found to have more of a tendinitis of the left shoulder. Evolving to more of a frozen shoulder. Patient was given an injection May 16 and was sent to formal physical therapy. Patient was to start increasing activity and range of motion. Patient has done very well. States the pain is significantly less. Nothing that is stopping her from activity.     Past Medical History:  Diagnosis Date  . Cataract    early stage  . Endometriosis   . Hypertension   . Migraines   . Mitral valve prolapse    Past Surgical History:  Procedure Laterality Date  . ABDOMINAL HYSTERECTOMY  1992   TAH and BSO   . BREAST SURGERY  1993   biopsy, benign   . COLONOSCOPY  06-15-14   per Dr. Carlean Purl, benign polyps, repeat in 10 yrs   . MENISCUS REPAIR     Social History   Social History  . Marital status: Single    Spouse name: N/A  . Number of children: N/A  . Years of education: N/A   Social History Main Topics  . Smoking status: Never Smoker  . Smokeless tobacco: Never Used  . Alcohol use 0.0 oz/week     Comment: glass of wine most nights  . Drug use: No  . Sexual activity: Not on file   Other Topics Concern  . Not on file   Social History Narrative  . No narrative on file   Allergies  Allergen Reactions  . Sulfonamide Derivatives    Family History  Problem Relation Age of Onset  . Hypertension Mother   . Hypertension Father   . Melanoma Father   . Melanoma Brother   . Colon cancer Maternal Aunt   . Breast cancer Maternal Aunt   . Breast cancer Maternal Grandmother     Past medical history, social, surgical and family history all reviewed in electronic medical record.  No pertanent information unless stated regarding to the chief  complaint.   Review of Systems: No headache, visual changes, nausea, vomiting, diarrhea, constipation, dizziness, abdominal pain, skin rash, fevers, chills, night sweats, weight loss, swollen lymph nodes, body aches, joint swelling, muscle aches, chest pain, shortness of breath, mood changes.   .   Objective  There were no vitals taken for this visit.   Systems examined below as of 01/27/17 General: NAD A&O x3 mood, affect normal  HEENT: Pupils equal, extraocular movements intact no nystagmus Respiratory: not short of breath at rest or with speaking Cardiovascular: No lower extremity edema, non tender Skin: Warm dry intact with no signs of infection or rash on extremities or on axial skeleton. Abdomen: Soft nontender, no masses Neuro: Cranial nerves  intact, neurovascularly intact in all extremities with 2+ DTRs and 2+ pulses. Lymph: No lymphadenopathy appreciated today  Gait normal with good balance and coordination.  MSK: Non tender with full range of motion and good stability and symmetric strength and tone of  elbows, wrist,  knee hips and ankles bilaterally.    .    Shoulder: Left Inspection reveals no abnormalities, atrophy or asymmetry. Palpation is normal with no tenderness over AC joint or bicipital groove. Significant improvement in range of motion now only lacking the last 5 of external rotation  Talar cuff strength 4+ out of 5 compared to the contralateral side Positive impingement still noted Speeds and Yergason's tests normal. Positive labral pathology Normal scapular function observed. No painful arc and no drop arm sign. No apprehension sign Contralateral shoulder unremarkable       Impression and Recommendations:     This case required medical decision making of moderate complexity.      Note: This dictation was prepared with Dragon dictation along with smaller phrase technology. Any transcriptional errors that result from this process are  unintentional.

## 2017-02-02 DIAGNOSIS — M7502 Adhesive capsulitis of left shoulder: Secondary | ICD-10-CM | POA: Diagnosis not present

## 2017-02-05 DIAGNOSIS — M7502 Adhesive capsulitis of left shoulder: Secondary | ICD-10-CM | POA: Diagnosis not present

## 2017-02-06 ENCOUNTER — Other Ambulatory Visit: Payer: Self-pay | Admitting: Family Medicine

## 2017-02-09 DIAGNOSIS — M7502 Adhesive capsulitis of left shoulder: Secondary | ICD-10-CM | POA: Diagnosis not present

## 2017-02-12 DIAGNOSIS — M7502 Adhesive capsulitis of left shoulder: Secondary | ICD-10-CM | POA: Diagnosis not present

## 2017-02-15 DIAGNOSIS — L7211 Pilar cyst: Secondary | ICD-10-CM | POA: Diagnosis not present

## 2017-02-25 ENCOUNTER — Encounter: Payer: Self-pay | Admitting: Family Medicine

## 2017-02-25 ENCOUNTER — Ambulatory Visit (INDEPENDENT_AMBULATORY_CARE_PROVIDER_SITE_OTHER): Payer: BLUE CROSS/BLUE SHIELD | Admitting: Family Medicine

## 2017-02-25 VITALS — BP 109/75 | HR 66 | Temp 98.2°F | Ht 63.0 in | Wt 154.0 lb

## 2017-02-25 DIAGNOSIS — I10 Essential (primary) hypertension: Secondary | ICD-10-CM

## 2017-02-25 DIAGNOSIS — M7502 Adhesive capsulitis of left shoulder: Secondary | ICD-10-CM | POA: Diagnosis not present

## 2017-02-25 MED ORDER — LOSARTAN POTASSIUM 50 MG PO TABS: 50.0000 mg | ORAL_TABLET | Freq: Every day | ORAL | 3 refills | Status: DC

## 2017-02-25 NOTE — Progress Notes (Signed)
   Subjective:    Patient ID: Kelly Holder, female    DOB: 29-May-1951, 66 y.o.   MRN: [id number]  HPI Here with questions about her new diet plan. She has always been a vegetarian who does eat eggs and dairy products. Now she wants to start on a vegetarian ketogenic diet. She has heard that this makes many people's BP drop and she asks for advice. Her BP at home has been well controlled with systolic readings in the 412-820 range. She feels great.    Review of Systems  Constitutional: Negative.   Respiratory: Negative.   Cardiovascular: Negative.   Gastrointestinal: Negative.   Neurological: Negative.        Objective:   Physical Exam  Constitutional: She is oriented to person, place, and time. She appears well-developed and well-nourished.  Cardiovascular: Normal rate, regular rhythm, normal heart sounds and intact distal pulses.   Pulmonary/Chest: Effort normal and breath sounds normal. No respiratory distress. She has no wheezes. She has no rales.  Musculoskeletal: She exhibits no edema.  Neurological: She is alert and oriented to person, place, and time.          Assessment & Plan:  Her HTN is well controlled but since she will be starting this new diet, we will go ahead an stop the Losartan HCT and change to Losartan 50 mg daily along with the usual Amlodipine. She will monitor the BP at home and give [place] a report in 2-3 weeks.  [name], MD

## 2017-02-25 NOTE — Patient Instructions (Signed)
WE NOW OFFER   [facility] FAST TRACK!!!  SAME DAY Appointments for ACUTE CARE  Such as: Sprains, Injuries, cuts, abrasions, rashes, muscle pain, joint pain, back pain Colds, flu, sore throats, headache, allergies, cough, fever  Ear pain, sinus and eye infections Abdominal pain, nausea, vomiting, diarrhea, upset stomach Animal/insect bites  3 Easy Ways to Schedule: Walk-In Scheduling Call in scheduling Mychart Sign-up: [email or web address]         

## 2017-02-26 DIAGNOSIS — L538 Other specified erythematous conditions: Secondary | ICD-10-CM | POA: Diagnosis not present

## 2017-02-26 DIAGNOSIS — L7211 Pilar cyst: Secondary | ICD-10-CM | POA: Diagnosis not present

## 2017-02-26 DIAGNOSIS — R208 Other disturbances of skin sensation: Secondary | ICD-10-CM | POA: Diagnosis not present

## 2017-03-12 DIAGNOSIS — M7502 Adhesive capsulitis of left shoulder: Secondary | ICD-10-CM | POA: Diagnosis not present

## 2017-03-17 ENCOUNTER — Ambulatory Visit: Payer: BLUE CROSS/BLUE SHIELD | Admitting: Family Medicine

## 2017-03-22 ENCOUNTER — Ambulatory Visit (INDEPENDENT_AMBULATORY_CARE_PROVIDER_SITE_OTHER): Payer: BLUE CROSS/BLUE SHIELD | Admitting: Ophthalmology

## 2017-03-22 DIAGNOSIS — H33301 Unspecified retinal break, right eye: Secondary | ICD-10-CM

## 2017-03-22 DIAGNOSIS — D3132 Benign neoplasm of left choroid: Secondary | ICD-10-CM | POA: Diagnosis not present

## 2017-03-22 DIAGNOSIS — I10 Essential (primary) hypertension: Secondary | ICD-10-CM

## 2017-03-22 DIAGNOSIS — H35371 Puckering of macula, right eye: Secondary | ICD-10-CM

## 2017-03-22 DIAGNOSIS — H43813 Vitreous degeneration, bilateral: Secondary | ICD-10-CM | POA: Diagnosis not present

## 2017-03-22 DIAGNOSIS — H35033 Hypertensive retinopathy, bilateral: Secondary | ICD-10-CM

## 2017-03-22 DIAGNOSIS — H2513 Age-related nuclear cataract, bilateral: Secondary | ICD-10-CM | POA: Diagnosis not present

## 2017-03-25 DIAGNOSIS — M7502 Adhesive capsulitis of left shoulder: Secondary | ICD-10-CM | POA: Diagnosis not present

## 2017-04-02 DIAGNOSIS — T1490XA Injury, unspecified, initial encounter: Secondary | ICD-10-CM | POA: Diagnosis not present

## 2017-04-02 DIAGNOSIS — W57XXXA Bitten or stung by nonvenomous insect and other nonvenomous arthropods, initial encounter: Secondary | ICD-10-CM | POA: Diagnosis not present

## 2017-04-02 DIAGNOSIS — M7502 Adhesive capsulitis of left shoulder: Secondary | ICD-10-CM | POA: Diagnosis not present

## 2017-04-02 DIAGNOSIS — L309 Dermatitis, unspecified: Secondary | ICD-10-CM | POA: Diagnosis not present

## 2017-04-05 NOTE — Progress Notes (Signed)
[facility] [address] [facility] [place], [place] [address] Phone: [phone] Subjective:    CC: Left shoulder follow-up  [id number]  Kelly Holder is a 66 y.o. female coming in with complaint of left shoulder pain. Patient was seen previously and was diagnosed with more of an adhesive capsulitis. Was making significant improvement with physical therapy 10 weeks ago. Patient states Proximal a 65-70% better. Feels physical therapy has been helpful. Patient has not been very compliant with home exercises. Still not completely improved    Past Medical History:  Diagnosis Date  . Cataract    early stage  . Endometriosis   . Hypertension   . Migraines   . Mitral valve prolapse    Past Surgical History:  Procedure Laterality Date  . ABDOMINAL HYSTERECTOMY  1992   TAH and BSO   . BREAST SURGERY  1993   biopsy, benign   . COLONOSCOPY  06-15-14   per Dr. Carlean Purl, benign polyps, repeat in 10 yrs   . MENISCUS REPAIR     Social History   Social History  . Marital status: Single    Spouse name: N/A  . Number of children: N/A  . Years of education: N/A   Social History Main Topics  . Smoking status: Never Smoker  . Smokeless tobacco: Never Used  . Alcohol use 0.0 oz/week     Comment: glass of wine most nights  . Drug use: No  . Sexual activity: Not Asked   Other Topics Concern  . None   Social History Narrative  . None   Allergies  Allergen Reactions  . Sulfonamide Derivatives    Family History  Problem Relation Age of Onset  . Hypertension Mother   . Hypertension Father   . Melanoma Father   . Melanoma Brother   . Colon cancer Maternal Aunt   . Breast cancer Maternal Aunt   . Breast cancer Maternal Grandmother      Past medical history, social, surgical and family history all reviewed in electronic medical record.  No pertanent information unless stated regarding to the chief complaint.   Review of Systems: No headache, visual  changes, nausea, vomiting, diarrhea, constipation, dizziness, abdominal pain, skin rash, fevers, chills, night sweats, weight loss, swollen lymph nodes, body aches, joint swelling, muscle aches, chest pain, shortness of breath, mood changes.    Objective  Blood pressure 120/76, pulse 68, height 5\' 3"  (1.6 m), weight 151 lb (68.5 kg). Systems examined below as of 04/06/17   General: No apparent distress alert and oriented x3 mood and affect normal, dressed appropriately.  HEENT: Pupils equal, extraocular movements intact  Respiratory: Patient's speak in full sentences and does not appear short of breath  Cardiovascular: No lower extremity edema, non tender, no erythema  Skin: Warm dry intact with no signs of infection or rash on extremities or on axial skeleton.  Abdomen: Soft nontender  Neuro: Cranial nerves II through XII are intact, neurovascularly intact in all extremities with 2+ DTRs and 2+ pulses.  Lymph: No lymphadenopathy of posterior or anterior cervical chain or axillae bilaterally.  Gait normal with good balance and coordination.  MSK:  Non tender with full range of motion and good stability and symmetric strength and tone of  elbows, wrist, hip, knee and ankles bilaterally.  Shoulder: Left Inspection reveals no abnormalities, atrophy or asymmetry. Palpation is normal with no tenderness over AC joint or bicipital groove. Lacks last 10 of forward flexion minimal external rotation still noted  Rotator cuff strength 4+ out of 5 Positive impingement still noted. Speeds and Yergason's tests normal. No labral pathology noted with negative Obrien's, negative clunk and good stability. Normal scapular function observed. No painful arc and no drop arm sign. No apprehension sign Contralateral shoulder unremarkable.      Impression and Recommendations:     This case required medical decision making of moderate complexity.      Note: This dictation was prepared with Dragon  dictation along with smaller phrase technology. Any transcriptional errors that result from this process are unintentional.

## 2017-04-06 ENCOUNTER — Ambulatory Visit (INDEPENDENT_AMBULATORY_CARE_PROVIDER_SITE_OTHER): Payer: BLUE CROSS/BLUE SHIELD | Admitting: Family Medicine

## 2017-04-06 ENCOUNTER — Encounter: Payer: Self-pay | Admitting: Family Medicine

## 2017-04-06 DIAGNOSIS — M7502 Adhesive capsulitis of left shoulder: Secondary | ICD-10-CM

## 2017-04-06 NOTE — Assessment & Plan Note (Signed)
Still has some limited range of motion. We discussed the possibility of another injection which patient declined. We discussed icing regimen, we discussed which activities to do in which ones to avoid. Patient will continue with home exercises. We did bring about the idea of an MRI but this was not change management with patient not wanting any surgical intervention. Patient will follow-up with me again in 6-8 weeks for further evaluation and treatment.

## 2017-04-06 NOTE — Patient Instructions (Signed)
Good to see you  Kelly Holder is your friend.  Stay active.  Keep working on the range of motion Keep up on the vitamins See em again in 6-8 weeks

## 2017-04-22 ENCOUNTER — Encounter: Payer: Self-pay | Admitting: Family Medicine

## 2017-05-07 ENCOUNTER — Other Ambulatory Visit: Payer: Self-pay | Admitting: Family Medicine

## 2017-05-07 NOTE — Telephone Encounter (Signed)
Refill done.  

## 2017-06-09 ENCOUNTER — Encounter: Payer: Self-pay | Admitting: Family Medicine

## 2017-06-09 ENCOUNTER — Ambulatory Visit (INDEPENDENT_AMBULATORY_CARE_PROVIDER_SITE_OTHER): Payer: BLUE CROSS/BLUE SHIELD | Admitting: Family Medicine

## 2017-06-09 VITALS — BP 128/74 | Temp 98.0°F | Ht 63.0 in | Wt 140.0 lb

## 2017-06-09 DIAGNOSIS — Z0000 Encounter for general adult medical examination without abnormal findings: Secondary | ICD-10-CM | POA: Diagnosis not present

## 2017-06-09 LAB — LIPID PANEL
CHOL/HDL RATIO: 4
Cholesterol: 179 mg/dL (ref 0–200)
HDL: 45.2 mg/dL (ref >39.00)
LDL CALC: 117 mg/dL — AB (ref 0–99)
NONHDL: 134.27
TRIGLYCERIDES: 86 mg/dL (ref 0.0–149.0)
VLDL: 17.2 mg/dL (ref 0.0–40.0)

## 2017-06-09 LAB — TSH: TSH: 2.28 u[IU]/mL (ref 0.35–4.50)

## 2017-06-09 LAB — HEPATIC FUNCTION PANEL
ALT: 15 U/L (ref 0–35)
AST: 19 U/L (ref 0–37)
Albumin: 3.9 g/dL (ref 3.5–5.2)
Alkaline Phosphatase: 61 U/L (ref 39–117)
BILIRUBIN TOTAL: 0.6 mg/dL (ref 0.2–1.2)
Bilirubin, Direct: 0.1 mg/dL (ref 0.0–0.3)
Total Protein: 6.5 g/dL (ref 6.0–8.3)

## 2017-06-09 LAB — CBC WITH DIFFERENTIAL/PLATELET
BASOS ABS: 0.1 10*3/uL (ref 0.0–0.1)
Basophils Relative: 0.7 % (ref 0.0–3.0)
Eosinophils Absolute: 0.1 10*3/uL (ref 0.0–0.7)
Eosinophils Relative: 0.9 % (ref 0.0–5.0)
HCT: 41.8 % (ref 36.0–46.0)
Hemoglobin: 13.7 g/dL (ref 12.0–15.0)
LYMPHS ABS: 2.1 10*3/uL (ref 0.7–4.0)
LYMPHS PCT: 24.9 % (ref 12.0–46.0)
MCHC: 32.7 g/dL (ref 30.0–36.0)
MCV: 88.7 fl (ref 78.0–100.0)
MONOS PCT: 5.8 % (ref 3.0–12.0)
Monocytes Absolute: 0.5 10*3/uL (ref 0.1–1.0)
NEUTROS PCT: 67.7 % (ref 43.0–77.0)
Neutro Abs: 5.6 10*3/uL (ref 1.4–7.7)
Platelets: 250 10*3/uL (ref 150.0–400.0)
RBC: 4.72 Mil/uL (ref 3.87–5.11)
RDW: 14.5 % (ref 11.5–15.5)
WBC: 8.3 10*3/uL (ref 4.0–10.5)

## 2017-06-09 LAB — POC URINALSYSI DIPSTICK (AUTOMATED)
Bilirubin, UA: NEGATIVE
Blood, UA: NEGATIVE
Glucose, UA: NEGATIVE
Ketones, UA: NEGATIVE
LEUKOCYTES UA: NEGATIVE
Nitrite, UA: NEGATIVE
PH UA: 6 (ref 5.0–8.0)
PROTEIN UA: NEGATIVE
Spec Grav, UA: 1.01 (ref 1.010–1.025)
UROBILINOGEN UA: 0.2 U/dL

## 2017-06-09 LAB — BASIC METABOLIC PANEL
BUN: 10 mg/dL (ref 6–23)
CO2: 29 mEq/L (ref 19–32)
CREATININE: 0.57 mg/dL (ref 0.40–1.20)
Calcium: 9.7 mg/dL (ref 8.4–10.5)
Chloride: 102 mEq/L (ref 96–112)
GFR: 113.34 mL/min (ref >60.00)
Glucose, Bld: 76 mg/dL (ref 70–99)
Potassium: 5 mEq/L (ref 3.5–5.1)
Sodium: 140 mEq/L (ref 135–145)

## 2017-06-09 MED ORDER — AMLODIPINE BESYLATE 5 MG PO TABS: 5.0000 mg | ORAL_TABLET | Freq: Every day | ORAL | 0 refills | Status: DC

## 2017-06-09 MED ORDER — LOSARTAN POTASSIUM 50 MG PO TABS: 50.0000 mg | ORAL_TABLET | Freq: Every day | ORAL | 3 refills | Status: DC

## 2017-06-09 MED ORDER — POTASSIUM CHLORIDE ER 10 MEQ PO TBCR: 10.0000 meq | EXTENDED_RELEASE_TABLET | Freq: Every day | ORAL | 0 refills | Status: DC

## 2017-06-09 MED ORDER — POTASSIUM CHLORIDE ER 10 MEQ PO TBCR: 10.0000 meq | EXTENDED_RELEASE_TABLET | Freq: Every day | ORAL | 3 refills | Status: DC

## 2017-06-09 MED ORDER — AMLODIPINE BESYLATE 5 MG PO TABS: 5.0000 mg | ORAL_TABLET | Freq: Every day | ORAL | 3 refills | Status: DC

## 2017-06-09 MED ORDER — LOSARTAN POTASSIUM 50 MG PO TABS: 50.0000 mg | ORAL_TABLET | Freq: Every day | ORAL | 0 refills | Status: DC

## 2017-06-09 NOTE — Progress Notes (Signed)
   Subjective:    Patient ID: Kelly Holder, female    DOB: Aug 07, 1950, 66 y.o.   MRN: [id number]  HPI Here for a well exam. She feels great. She is on the ketogenic diet and has lost 14 lbs.    Review of Systems  Constitutional: Negative.   HENT: Negative.   Eyes: Negative.   Respiratory: Negative.   Cardiovascular: Negative.   Gastrointestinal: Negative.   Genitourinary: Negative for decreased urine volume, difficulty urinating, dyspareunia, dysuria, enuresis, flank pain, frequency, hematuria, pelvic pain and urgency.  Musculoskeletal: Negative.   Skin: Negative.   Neurological: Negative.   Psychiatric/Behavioral: Negative.        Objective:   Physical Exam  Constitutional: She is oriented to person, place, and time. She appears well-developed and well-nourished. No distress.  HENT:  Head: Normocephalic and atraumatic.  Right Ear: External ear normal.  Left Ear: External ear normal.  Nose: Nose normal.  Mouth/Throat: Oropharynx is clear and moist. No oropharyngeal exudate.  Eyes: Conjunctivae and EOM are normal. Pupils are equal, round, and reactive to light. No scleral icterus.  Neck: Normal range of motion. Neck supple. No JVD present. No thyromegaly present.  Cardiovascular: Normal rate, regular rhythm, normal heart sounds and intact distal pulses. Exam reveals no gallop and no friction rub.  No murmur heard. Pulmonary/Chest: Effort normal and breath sounds normal. No respiratory distress. She has no wheezes. She has no rales. She exhibits no tenderness.  Abdominal: Soft. Bowel sounds are normal. She exhibits no distension and no mass. There is no tenderness. There is no rebound and no guarding.  Musculoskeletal: Normal range of motion. She exhibits no edema or tenderness.  Lymphadenopathy:    She has no cervical adenopathy.  Neurological: She is alert and oriented to person, place, and time. She has normal reflexes. No cranial nerve deficit. She exhibits normal muscle tone.  Coordination normal.  Skin: Skin is warm and dry. No rash noted. No erythema.  Psychiatric: She has a normal mood and affect. Her behavior is normal. Judgment and thought content normal.          Assessment & Plan:  Well exam. We discussed diet and exercise. Get fasting labs.  [name], MD

## 2017-06-09 NOTE — Patient Instructions (Signed)
WE NOW OFFER   [facility] FAST TRACK!!!  SAME DAY Appointments for ACUTE CARE  Such as: Sprains, Injuries, cuts, abrasions, rashes, muscle pain, joint pain, back pain Colds, flu, sore throats, headache, allergies, cough, fever  Ear pain, sinus and eye infections Abdominal pain, nausea, vomiting, diarrhea, upset stomach Animal/insect bites  3 Easy Ways to Schedule: Walk-In Scheduling Call in scheduling Mychart Sign-up: [email or web address]         

## 2017-06-21 DIAGNOSIS — L578 Other skin changes due to chronic exposure to nonionizing radiation: Secondary | ICD-10-CM | POA: Diagnosis not present

## 2017-06-21 DIAGNOSIS — Z808 Family history of malignant neoplasm of other organs or systems: Secondary | ICD-10-CM | POA: Diagnosis not present

## 2017-06-21 DIAGNOSIS — D225 Melanocytic nevi of trunk: Secondary | ICD-10-CM | POA: Diagnosis not present

## 2017-06-21 DIAGNOSIS — L570 Actinic keratosis: Secondary | ICD-10-CM | POA: Diagnosis not present

## 2017-07-31 ENCOUNTER — Other Ambulatory Visit: Payer: Self-pay | Admitting: Family Medicine

## 2017-10-19 ENCOUNTER — Encounter (INDEPENDENT_AMBULATORY_CARE_PROVIDER_SITE_OTHER): Payer: BLUE CROSS/BLUE SHIELD | Admitting: Ophthalmology

## 2017-10-19 DIAGNOSIS — H2513 Age-related nuclear cataract, bilateral: Secondary | ICD-10-CM

## 2017-10-19 DIAGNOSIS — H43813 Vitreous degeneration, bilateral: Secondary | ICD-10-CM

## 2017-10-19 DIAGNOSIS — D3132 Benign neoplasm of left choroid: Secondary | ICD-10-CM | POA: Diagnosis not present

## 2017-10-19 DIAGNOSIS — H35372 Puckering of macula, left eye: Secondary | ICD-10-CM

## 2017-10-19 DIAGNOSIS — H33303 Unspecified retinal break, bilateral: Secondary | ICD-10-CM | POA: Diagnosis not present

## 2017-11-02 ENCOUNTER — Encounter (INDEPENDENT_AMBULATORY_CARE_PROVIDER_SITE_OTHER): Payer: BLUE CROSS/BLUE SHIELD | Admitting: Ophthalmology

## 2017-11-02 DIAGNOSIS — H33302 Unspecified retinal break, left eye: Secondary | ICD-10-CM

## 2017-12-30 ENCOUNTER — Other Ambulatory Visit: Payer: Self-pay | Admitting: Family Medicine

## 2017-12-30 DIAGNOSIS — Z1231 Encounter for screening mammogram for malignant neoplasm of breast: Secondary | ICD-10-CM

## 2018-01-04 DIAGNOSIS — H40013 Open angle with borderline findings, low risk, bilateral: Secondary | ICD-10-CM | POA: Diagnosis not present

## 2018-01-17 DIAGNOSIS — Z0000 Encounter for general adult medical examination without abnormal findings: Secondary | ICD-10-CM | POA: Diagnosis not present

## 2018-01-20 ENCOUNTER — Ambulatory Visit
Admission: RE | Admit: 2018-01-20 | Discharge: 2018-01-20 | Disposition: A | Discharge status: Home / Self Care | Payer: BLUE CROSS/BLUE SHIELD | Source: Ambulatory Visit | Attending: Family Medicine | Admitting: Family Medicine

## 2018-01-20 DIAGNOSIS — Z1231 Encounter for screening mammogram for malignant neoplasm of breast: Secondary | ICD-10-CM | POA: Diagnosis not present

## 2018-02-07 DIAGNOSIS — H25043 Posterior subcapsular polar age-related cataract, bilateral: Secondary | ICD-10-CM | POA: Diagnosis not present

## 2018-03-07 ENCOUNTER — Encounter (INDEPENDENT_AMBULATORY_CARE_PROVIDER_SITE_OTHER): Payer: BLUE CROSS/BLUE SHIELD | Admitting: Ophthalmology

## 2018-03-07 DIAGNOSIS — H2513 Age-related nuclear cataract, bilateral: Secondary | ICD-10-CM

## 2018-03-07 DIAGNOSIS — H33303 Unspecified retinal break, bilateral: Secondary | ICD-10-CM

## 2018-03-07 DIAGNOSIS — H35372 Puckering of macula, left eye: Secondary | ICD-10-CM

## 2018-03-07 DIAGNOSIS — D3132 Benign neoplasm of left choroid: Secondary | ICD-10-CM | POA: Diagnosis not present

## 2018-03-07 DIAGNOSIS — H43813 Vitreous degeneration, bilateral: Secondary | ICD-10-CM

## 2018-05-23 ENCOUNTER — Other Ambulatory Visit: Payer: Self-pay | Admitting: Family Medicine

## 2018-05-31 ENCOUNTER — Other Ambulatory Visit: Payer: Self-pay | Admitting: Family Medicine

## 2018-06-17 ENCOUNTER — Ambulatory Visit (INDEPENDENT_AMBULATORY_CARE_PROVIDER_SITE_OTHER): Payer: BLUE CROSS/BLUE SHIELD | Admitting: Family Medicine

## 2018-06-17 ENCOUNTER — Encounter: Payer: Self-pay | Admitting: Family Medicine

## 2018-06-17 VITALS — BP 100/62 | HR 82 | Temp 98.2°F | Ht 62.5 in | Wt 114.2 lb

## 2018-06-17 DIAGNOSIS — Z0000 Encounter for general adult medical examination without abnormal findings: Secondary | ICD-10-CM

## 2018-06-17 MED ORDER — POTASSIUM CHLORIDE ER 10 MEQ PO TBCR: 10.0000 meq | EXTENDED_RELEASE_TABLET | Freq: Every day | ORAL | 3 refills | Status: DC

## 2018-06-17 MED ORDER — LOSARTAN POTASSIUM 50 MG PO TABS: 50.0000 mg | ORAL_TABLET | Freq: Every day | ORAL | 3 refills | Status: DC

## 2018-06-17 NOTE — Progress Notes (Signed)
   Subjective:    Patient ID: Kelly Holder, female    DOB: [birth year], 67 y.o.   MRN: [id number]  HPI Here for a well exam. She is doing well in general. She is on a ketogenic diet and she has lost 43 lbs. She does mention occasional episodes of her left index finger turning white for 5-10 minutes and then returning to normal. There is no pain.    Review of Systems  Constitutional: Negative.   HENT: Negative.   Eyes: Negative.   Respiratory: Negative.   Cardiovascular: Negative.   Gastrointestinal: Negative.   Genitourinary: Negative for decreased urine volume, difficulty urinating, dyspareunia, dysuria, enuresis, flank pain, frequency, hematuria, pelvic pain and urgency.  Musculoskeletal: Negative.   Skin: Positive for color change.  Neurological: Negative.   Psychiatric/Behavioral: Negative.        Objective:   Physical Exam  Constitutional: She is oriented to person, place, and time. She appears well-developed and well-nourished. No distress.  HENT:  Head: Normocephalic and atraumatic.  Right Ear: External ear normal.  Left Ear: External ear normal.  Nose: Nose normal.  Mouth/Throat: Oropharynx is clear and moist. No oropharyngeal exudate.  Eyes: Pupils are equal, round, and reactive to light. Conjunctivae and EOM are normal. No scleral icterus.  Neck: Normal range of motion. Neck supple. No JVD present. No thyromegaly present.  Cardiovascular: Normal rate, regular rhythm, normal heart sounds and intact distal pulses. Exam reveals no gallop and no friction rub.  No murmur heard. Pulmonary/Chest: Effort normal and breath sounds normal. No respiratory distress. She has no wheezes. She has no rales. She exhibits no tenderness.  Abdominal: Soft. Bowel sounds are normal. She exhibits no distension and no mass. There is no tenderness. There is no rebound and no guarding.  Musculoskeletal: Normal range of motion. She exhibits no edema or tenderness.  Lymphadenopathy:    She has no  cervical adenopathy.  Neurological: She is alert and oriented to person, place, and time. She has normal reflexes. She displays normal reflexes. No cranial nerve deficit. She exhibits normal muscle tone. Coordination normal.  Skin: Skin is warm and dry. No rash noted. No erythema.  Psychiatric: She has a normal mood and affect. Her behavior is normal. Judgment and thought content normal.          Assessment & Plan:  Well exam. We discussed diet and exercise. Set up fasting labs soon. She has a mild form of Raynauds. We discussed this and she will follow up as needed. As for her HTN, she is now overly medicated so she will stop the Amlodipine.  [name], MD

## 2018-06-20 ENCOUNTER — Other Ambulatory Visit (INDEPENDENT_AMBULATORY_CARE_PROVIDER_SITE_OTHER): Payer: BLUE CROSS/BLUE SHIELD

## 2018-06-20 DIAGNOSIS — Z0000 Encounter for general adult medical examination without abnormal findings: Secondary | ICD-10-CM | POA: Diagnosis not present

## 2018-06-20 LAB — POC URINALSYSI DIPSTICK (AUTOMATED)
Bilirubin, UA: NEGATIVE
Glucose, UA: NEGATIVE
Ketones, UA: NEGATIVE
Leukocytes, UA: NEGATIVE
NITRITE UA: NEGATIVE
PH UA: 6 (ref 5.0–8.0)
Protein, UA: NEGATIVE
RBC UA: NEGATIVE
Spec Grav, UA: 1.01 (ref 1.010–1.025)
UROBILINOGEN UA: 0.2 U/dL

## 2018-06-20 LAB — BASIC METABOLIC PANEL
BUN: 16 mg/dL (ref 6–23)
CALCIUM: 9.6 mg/dL (ref 8.4–10.5)
CHLORIDE: 102 meq/L (ref 96–112)
CO2: 27 mEq/L (ref 19–32)
Creatinine, Ser: 0.68 mg/dL (ref 0.40–1.20)
GFR: 92.16 mL/min (ref >60.00)
GLUCOSE: 86 mg/dL (ref 70–99)
Potassium: 5.2 mEq/L — ABNORMAL HIGH (ref 3.5–5.1)
SODIUM: 138 meq/L (ref 135–145)

## 2018-06-20 LAB — HEPATIC FUNCTION PANEL
ALT: 21 U/L (ref 0–35)
AST: 21 U/L (ref 0–37)
Albumin: 4.3 g/dL (ref 3.5–5.2)
Alkaline Phosphatase: 41 U/L (ref 39–117)
BILIRUBIN TOTAL: 0.5 mg/dL (ref 0.2–1.2)
Bilirubin, Direct: 0.1 mg/dL (ref 0.0–0.3)
Total Protein: 6.4 g/dL (ref 6.0–8.3)

## 2018-06-20 LAB — CBC WITH DIFFERENTIAL/PLATELET
BASOS ABS: 0 10*3/uL (ref 0.0–0.1)
Basophils Relative: 1 % (ref 0.0–3.0)
EOS ABS: 0.1 10*3/uL (ref 0.0–0.7)
Eosinophils Relative: 1.7 % (ref 0.0–5.0)
HCT: 41.3 % (ref 36.0–46.0)
Hemoglobin: 13.9 g/dL (ref 12.0–15.0)
Lymphocytes Relative: 37.3 % (ref 12.0–46.0)
Lymphs Abs: 1.8 10*3/uL (ref 0.7–4.0)
MCHC: 33.7 g/dL (ref 30.0–36.0)
MCV: 91.5 fl (ref 78.0–100.0)
Monocytes Absolute: 0.4 10*3/uL (ref 0.1–1.0)
Monocytes Relative: 7.5 % (ref 3.0–12.0)
NEUTROS ABS: 2.5 10*3/uL (ref 1.4–7.7)
Neutrophils Relative %: 52.5 % (ref 43.0–77.0)
PLATELETS: 167 10*3/uL (ref 150.0–400.0)
RBC: 4.51 Mil/uL (ref 3.87–5.11)
RDW: 13.9 % (ref 11.5–15.5)
WBC: 4.7 10*3/uL (ref 4.0–10.5)

## 2018-06-20 LAB — LIPID PANEL
CHOL/HDL RATIO: 3
Cholesterol: 211 mg/dL — ABNORMAL HIGH (ref 0–200)
HDL: 71.4 mg/dL (ref >39.00)
LDL CALC: 127 mg/dL — AB (ref 0–99)
NONHDL: 139.96
Triglycerides: 64 mg/dL (ref 0.0–149.0)
VLDL: 12.8 mg/dL (ref 0.0–40.0)

## 2018-06-20 LAB — TSH: TSH: 3.67 u[IU]/mL (ref 0.35–4.50)

## 2018-06-21 LAB — LDL-P: LDL Particle Number: 1243 nmol/L — ABNORMAL HIGH (ref <1000)

## 2018-06-22 ENCOUNTER — Encounter: Payer: Self-pay | Admitting: *Deleted

## 2018-07-08 DIAGNOSIS — H40013 Open angle with borderline findings, low risk, bilateral: Secondary | ICD-10-CM | POA: Diagnosis not present

## 2018-09-05 ENCOUNTER — Encounter (INDEPENDENT_AMBULATORY_CARE_PROVIDER_SITE_OTHER): Payer: BLUE CROSS/BLUE SHIELD | Admitting: Ophthalmology

## 2018-09-09 ENCOUNTER — Encounter (INDEPENDENT_AMBULATORY_CARE_PROVIDER_SITE_OTHER): Payer: BLUE CROSS/BLUE SHIELD | Admitting: Ophthalmology

## 2018-09-14 ENCOUNTER — Encounter (INDEPENDENT_AMBULATORY_CARE_PROVIDER_SITE_OTHER): Payer: BLUE CROSS/BLUE SHIELD | Admitting: Ophthalmology

## 2018-09-14 DIAGNOSIS — H33022 Retinal detachment with multiple breaks, left eye: Secondary | ICD-10-CM

## 2018-09-14 DIAGNOSIS — H35372 Puckering of macula, left eye: Secondary | ICD-10-CM

## 2018-09-14 DIAGNOSIS — H33303 Unspecified retinal break, bilateral: Secondary | ICD-10-CM | POA: Diagnosis not present

## 2018-09-14 DIAGNOSIS — D3132 Benign neoplasm of left choroid: Secondary | ICD-10-CM | POA: Diagnosis not present

## 2018-09-14 DIAGNOSIS — H43813 Vitreous degeneration, bilateral: Secondary | ICD-10-CM

## 2018-09-26 ENCOUNTER — Encounter (INDEPENDENT_AMBULATORY_CARE_PROVIDER_SITE_OTHER): Payer: BLUE CROSS/BLUE SHIELD | Admitting: Ophthalmology

## 2018-09-26 DIAGNOSIS — H33302 Unspecified retinal break, left eye: Secondary | ICD-10-CM

## 2019-01-30 ENCOUNTER — Encounter (INDEPENDENT_AMBULATORY_CARE_PROVIDER_SITE_OTHER): Payer: BC Managed Care – PPO | Admitting: Ophthalmology

## 2019-01-30 ENCOUNTER — Other Ambulatory Visit: Payer: Self-pay

## 2019-01-30 DIAGNOSIS — H33303 Unspecified retinal break, bilateral: Secondary | ICD-10-CM

## 2019-01-30 DIAGNOSIS — D3132 Benign neoplasm of left choroid: Secondary | ICD-10-CM

## 2019-01-30 DIAGNOSIS — H43813 Vitreous degeneration, bilateral: Secondary | ICD-10-CM | POA: Diagnosis not present

## 2019-01-30 DIAGNOSIS — H35372 Puckering of macula, left eye: Secondary | ICD-10-CM | POA: Diagnosis not present

## 2019-05-15 NOTE — Progress Notes (Signed)
[facility] [place], [place] [address] Phone: [phone] Subjective:   I Kelly Holder am serving as a Education administrator for Dr. Hulan Saas.   CC: Right knee pain Prescription [id number]   04/06/2017 Still has some limited range of motion. We discussed the possibility of another injection which patient declined. We discussed icing regimen, we discussed which activities to do in which ones to avoid. Patient will continue with home exercises. We did bring about the idea of an MRI but this was not change management with patient not wanting any surgical intervention. Patient will follow-up with me again in 6-8 weeks for further evaluation and treatment.  05/16/2019 Kelly Holder is a 68 y.o. female coming in with complaint of right knee pain. States that sometimes the knee feels week. Experiences some swelling. Limps at times due to pain and weakness. When she crosses her leg she feels a lump behind the knee.   Onset- 2 weeks ago Location- posterior, hamstring Duration-  Character- tight, sharp pain with twisting Aggravating factors- extension Reliving factors-  Therapies tried- pennsaid, ice, elevation  Severity-  6-7/10 at its worse      Past Medical History:  Diagnosis Date  . Cataract    early stage  . Endometriosis   . Hypertension   . Migraines   . Mitral valve prolapse    Past Surgical History:  Procedure Laterality Date  . ABDOMINAL HYSTERECTOMY  1992   TAH and BSO   . BREAST SURGERY  1993   biopsy, benign   . COLONOSCOPY  06-15-14   per Dr. Carlean Purl, benign polyps, repeat in 10 yrs   . MENISCUS REPAIR     Social History   Socioeconomic History  . Marital status: Single    Spouse name: Not on file  . Number of children: Not on file  . Years of education: Not on file  . Highest education level: Not on file  Occupational History  . Not on file  Social Needs  . Financial resource strain: Not on file  . Food insecurity   Worry: Not on file    Inability: Not on file  . Transportation needs    Medical: Not on file    Non-medical: Not on file  Tobacco Use  . Smoking status: Never Smoker  . Smokeless tobacco: Never Used  Substance and Sexual Activity  . Alcohol use: Yes    Alcohol/week: 0.0 standard drinks    Comment: glass of wine most nights  . Drug use: No  . Sexual activity: Not on file  Lifestyle  . Physical activity    Days per week: Not on file    Minutes per session: Not on file  . Stress: Not on file  Relationships  . Social Herbalist on phone: Not on file    Gets together: Not on file    Attends religious service: Not on file    Active member of club or organization: Not on file    Attends meetings of clubs or organizations: Not on file    Relationship status: Not on file  Other Topics Concern  . Not on file  Social History Narrative  . Not on file   Allergies  Allergen Reactions  . Sulfonamide Derivatives    Family History  Problem Relation Age of Onset  . Hypertension Mother   . Hypertension Father   . Melanoma Father   . Melanoma Brother   . Colon cancer Maternal  Aunt   . Breast cancer Maternal Aunt   . Breast cancer Maternal Grandmother      Current Outpatient Medications (Cardiovascular):  .  losartan (COZAAR) 50 MG tablet, TAKE 1 TABLET BY MOUTH DAILY     Current Outpatient Medications (Other):  Marland Kitchen  Latanoprost 0.005 % EMUL,  .  potassium chloride (K-DUR) 10 MEQ tablet, TAKE 1 TABLET BY MOUTH DAILY. GENERIC EQUIVALENT FOR KLOR-KON. Marland Kitchen  UNABLE TO FIND, Take 1 packet by mouth daily. Med Name: shaklee vitalizer vitamins    Past medical history, social, surgical and family history all reviewed in electronic medical record.  No pertanent information unless stated regarding to the chief complaint.   Review of Systems:  No headache, visual changes, nausea, vomiting, diarrhea, constipation, dizziness, abdominal pain, skin rash, fevers, chills, night sweats,  weight loss, swollen lymph nodes, body aches, joint swelling,  chest pain, shortness of breath, mood changes.  Positive muscle aches  Objective  Blood pressure 138/80, pulse 73, height 5' 2.5" (1.588 m), weight 116 lb (52.6 kg), SpO2 97 %.   General: No apparent distress alert and oriented x3 mood and affect normal, dressed appropriately.  HEENT: Pupils equal, extraocular movements intact  Respiratory: Patient's speak in full sentences and does not appear short of breath  Cardiovascular: No lower extremity edema, non tender, no erythema  Skin: Warm dry intact with no signs of infection or rash on extremities or on axial skeleton.  Abdomen: Soft nontender  Neuro: Cranial nerves II through XII are intact, neurovascularly intact in all extremities with 2+ DTRs and 2+ pulses.  Lymph: No lymphadenopathy of posterior or anterior cervical chain or axillae bilaterally.  Gait normal with good balance and coordination.  MSK:  Non tender with full range of motion and good stability and symmetric strength and tone of shoulders, elbows, wrist, hip and ankles bilaterally.  Right knee shows the some atrophy of the musculature surrounding the knee.  Patient has some fullness noted in the popliteal area medially.  Patient does have pain in the calf as well to compression.  Near full range of motion though noted.  Limited musculoskeletal ultrasound was performed and interpreted by Lyndal Pulley Limited musculoskeletal ultrasound the patient has what appears to be a lateral meniscal tear with 50% displacement.  Baker cyst noted as well that is fairly large.  Mild arthritic changes in the tricompartmental area. Impression: Baker's cyst and a lateral meniscal tear    Impression and Recommendations:     This case required medical decision making of moderate complexity. The above documentation has been reviewed and is accurate and complete Lyndal Pulley, DO       Note: This dictation was prepared with  Dragon dictation along with smaller phrase technology. Any transcriptional errors that result from this process are unintentional.

## 2019-05-16 ENCOUNTER — Ambulatory Visit (INDEPENDENT_AMBULATORY_CARE_PROVIDER_SITE_OTHER): Payer: BC Managed Care – PPO | Admitting: Family Medicine

## 2019-05-16 ENCOUNTER — Ambulatory Visit: Payer: Self-pay

## 2019-05-16 ENCOUNTER — Encounter: Payer: Self-pay | Admitting: Family Medicine

## 2019-05-16 ENCOUNTER — Ambulatory Visit (INDEPENDENT_AMBULATORY_CARE_PROVIDER_SITE_OTHER)
Admission: RE | Admit: 2019-05-16 | Discharge: 2019-05-16 | Disposition: A | Discharge status: Home / Self Care | Payer: BC Managed Care – PPO | Source: Ambulatory Visit | Attending: Family Medicine | Admitting: Family Medicine

## 2019-05-16 ENCOUNTER — Other Ambulatory Visit: Payer: Self-pay

## 2019-05-16 VITALS — BP 138/80 | HR 73 | Ht 62.5 in | Wt 116.0 lb

## 2019-05-16 DIAGNOSIS — M25561 Pain in right knee: Secondary | ICD-10-CM

## 2019-05-16 DIAGNOSIS — G8929 Other chronic pain: Secondary | ICD-10-CM

## 2019-05-16 DIAGNOSIS — M7121 Synovial cyst of popliteal space [Baker], right knee: Secondary | ICD-10-CM

## 2019-05-16 DIAGNOSIS — S83281A Other tear of lateral meniscus, current injury, right knee, initial encounter: Secondary | ICD-10-CM | POA: Insufficient documentation

## 2019-05-16 NOTE — Patient Instructions (Addendum)
Knee compression sleeve Exercise 3 times a week See me again in 4 weeks 1pm doppler appointment at [address]

## 2019-05-16 NOTE — Assessment & Plan Note (Addendum)
Lateral meniscal tear.  Discussed icing regimen and home exercises, which activities to do which wants to avoid.  Increase activity as tolerated.  Follow-up again in 4 to 8 weeks if no improvement consider injection.

## 2019-05-16 NOTE — Assessment & Plan Note (Signed)
Review persistent knee  compression, icing regimen and home exercises, seems Doppler of the right lower extremity to rule out any type of clot but I think this is low likelihood.  Increase activity as tolerated.  Follow-up in 4 to 8 weeks if not better consider injection

## 2019-05-17 ENCOUNTER — Ambulatory Visit (HOSPITAL_COMMUNITY)
Admission: RE | Admit: 2019-05-17 | Discharge: 2019-05-17 | Disposition: A | Discharge status: Home / Self Care | Payer: BC Managed Care – PPO | Source: Ambulatory Visit | Attending: Cardiology | Admitting: Cardiology

## 2019-05-17 DIAGNOSIS — G8929 Other chronic pain: Secondary | ICD-10-CM | POA: Diagnosis not present

## 2019-05-17 DIAGNOSIS — M25561 Pain in right knee: Secondary | ICD-10-CM | POA: Insufficient documentation

## 2019-06-06 ENCOUNTER — Other Ambulatory Visit: Payer: Self-pay | Admitting: Family Medicine

## 2019-06-11 NOTE — Progress Notes (Signed)
[facility] [address] [place], [place] [address] Phone: [phone] Subjective:   I Kelly Holder am serving as a Education administrator for Dr. Hulan Holder.  I'm seeing this patient by the request  of:    CC: Knee pain follow-up  [id number]   05/16/2019 Review persistent knee  compression, icing regimen and home exercises, seems Doppler of the right lower extremity to rule out any type of clot but I think this is low likelihood.  Increase activity as tolerated.  Follow-up in 4 to 8 weeks if not better consider injection  Update 06/13/2019 Kelly Holder is a 68 y.o. female coming in with complaint of right knee pain. Patient states she is doing better. Doing exercises and hasn't noticed much pain at this time. Hasn't noticed much swelling.  Overall would state that feeling about 90 to 95% better.    Doppler was negative u/s showed baker cyst and meniscal tear   Past Medical History:  Diagnosis Date  . Cataract    early stage  . Endometriosis   . Hypertension   . Migraines   . Mitral valve prolapse    Past Surgical History:  Procedure Laterality Date  . ABDOMINAL HYSTERECTOMY  1992   TAH and BSO   . BREAST SURGERY  1993   biopsy, benign   . COLONOSCOPY  06-15-14   per Dr. Carlean Holder, benign polyps, repeat in 10 yrs   . MENISCUS REPAIR     Social History   Socioeconomic History  . Marital status: Married    Spouse name: Not on file  . Number of children: Not on file  . Years of education: Not on file  . Highest education level: Not on file  Occupational History  . Not on file  Social Needs  . Financial resource strain: Not on file  . Food insecurity    Worry: Not on file    Inability: Not on file  . Transportation needs    Medical: Not on file    Non-medical: Not on file  Tobacco Use  . Smoking status: Never Smoker  . Smokeless tobacco: Never Used  Substance and Sexual Activity  . Alcohol use: Yes    Alcohol/week: 0.0 standard drinks   Comment: glass of wine most nights  . Drug use: No  . Sexual activity: Not on file  Lifestyle  . Physical activity    Days per week: Not on file    Minutes per session: Not on file  . Stress: Not on file  Relationships  . Social Herbalist on phone: Not on file    Gets together: Not on file    Attends religious service: Not on file    Active member of club or organization: Not on file    Attends meetings of clubs or organizations: Not on file    Relationship status: Not on file  Other Topics Concern  . Not on file  Social History Narrative  . Not on file   Allergies  Allergen Reactions  . Sulfonamide Derivatives    Family History  Problem Relation Age of Onset  . Hypertension Mother   . Hypertension Father   . Melanoma Father   . Melanoma Brother   . Colon cancer Maternal Aunt   . Breast cancer Maternal Aunt   . Breast cancer Maternal Grandmother      Current Outpatient Medications (Cardiovascular):  .  losartan (COZAAR) 50 MG tablet, TAKE 1 TABLET BY MOUTH DAILY  Current Outpatient Medications (Other):  Marland Kitchen  Latanoprost 0.005 % EMUL,  .  potassium chloride (KLOR-CON) 10 MEQ tablet, TAKE 1 TABLET BY MOUTH DAILY. GENERIC EQUIVALENT FOR KLOR-CON. Marland Kitchen  UNABLE TO FIND, Take 1 packet by mouth daily. Med Name: shaklee vitalizer vitamins    Past medical history, social, surgical and family history all reviewed in electronic medical record.  No pertanent information unless stated regarding to the chief complaint.   Review of Systems:  No headache, visual changes, nausea, vomiting, diarrhea, constipation, dizziness, abdominal pain, skin rash, fevers, chills, night sweats, weight loss, swollen lymph nodes, body aches, joint swelling, muscle aches, chest pain, shortness of breath, mood changes.   Objective  Blood pressure 134/60, pulse 74, height 5\' 2"  (1.575 m), weight 117 lb (53.1 kg), SpO2 97 %.    General: No apparent distress alert and oriented x3 mood  and affect normal, dressed appropriately.  HEENT: Pupils equal, extraocular movements intact  Respiratory: Patient's speak in full sentences and does not appear short of breath  Cardiovascular: No lower extremity edema, non tender, no erythema  Skin: Warm dry intact with no signs of infection or rash on extremities or on axial skeleton.  Abdomen: Soft nontender  Neuro: Cranial nerves II through XII are intact, neurovascularly intact in all extremities with 2+ DTRs and 2+ pulses.  Lymph: No lymphadenopathy of posterior or anterior cervical chain or axillae bilaterally.  Gait normal with good balance and coordination.  MSK:  tender with full range of motion and good stability and symmetric strength and tone of shoulders, elbows, wrist, hip and ankles bilaterally.  Knee: Right side Normal to inspection with no erythema or effusion or obvious bony abnormalities. Palpation normal with no warmth,  ROM full in flexion and extension and lower leg rotation. Ligaments with solid consistent endpoints including ACL, PCL, LCL, MCL.  Negative Mcmurray's, Apley's, and Thessalonian tests. painful patellar compression. Patellar glide mild crepitus. Patellar and quadriceps tendons unremarkable. Hamstring and quadriceps strength is normal.     Impression and Recommendations:     The above documentation has been reviewed and is accurate and complete Kelly Pulley, DO       Note: This dictation was prepared with Dragon dictation along with smaller phrase technology. Any transcriptional errors that result from this process are unintentional.

## 2019-06-13 ENCOUNTER — Ambulatory Visit (INDEPENDENT_AMBULATORY_CARE_PROVIDER_SITE_OTHER): Payer: BC Managed Care – PPO | Admitting: Family Medicine

## 2019-06-13 ENCOUNTER — Encounter: Payer: Self-pay | Admitting: Family Medicine

## 2019-06-13 ENCOUNTER — Other Ambulatory Visit: Payer: Self-pay

## 2019-06-13 DIAGNOSIS — M7121 Synovial cyst of popliteal space [Baker], right knee: Secondary | ICD-10-CM

## 2019-06-13 NOTE — Assessment & Plan Note (Signed)
Patient is having some pain overall.  Discussed which activities to do which wants to avoid.  We discussed icing regimen and home exercises.  Discussed topical anti-inflammatories.  Follow-up again in 4 to 8 weeks if any pain otherwise follow-up as needed

## 2019-07-11 DIAGNOSIS — H40013 Open angle with borderline findings, low risk, bilateral: Secondary | ICD-10-CM | POA: Diagnosis not present

## 2019-07-18 ENCOUNTER — Telehealth: Payer: Self-pay | Admitting: Family Medicine

## 2019-07-18 NOTE — Telephone Encounter (Signed)
Message Routed to PCP CMA 

## 2019-07-18 NOTE — Telephone Encounter (Signed)
Medication Refill - Medication: Diclofenac Sodium (PENNSAID) 2 % SOLN  Pt stated she has a torn meniscus and her insurance will stop covering rx soon and she would like a refill of two bottle to have when that happens as she does not use medication every day. Has the patient contacted their pharmacy? No. (Agent: If no, request that the patient contact the pharmacy for the refill.) (Agent: If yes, when and what did the pharmacy advise?)  Preferred Pharmacy (with phone number or street name):[facility] [place]-Pt stated it is a Psychologist, clinical. Please confirm. Not in Epic system  Agent: Please be advised that RX refills may take up to 3 business days. We ask that you follow-up with your pharmacy.

## 2019-07-18 NOTE — Telephone Encounter (Signed)
Please advise Rx is not on current med list. 

## 2019-07-18 NOTE — Telephone Encounter (Signed)
I have never seen her about her knee. She sees Dr. Charlann Boxer for this frequently. I suggest she ask him to prescribe this

## 2019-07-18 NOTE — Telephone Encounter (Signed)
Virl Axe D routed conversation to [employer] Nurse [employer] 5 minutes ago (10:49 AM)  Virl Axe D 5 minutes ago (10:49 AM)  [place]   Medication Refill - Medication: Diclofenac Sodium (PENNSAID) 2 % SOLN  Pt stated she has a torn meniscus and her insurance will stop covering rx soon and she would like a refill of two bottle to have when that happens as she does not use medication every day. Has the patient contacted their pharmacy? No. (Agent: If no, request that the patient contact the pharmacy for the refill.) (Agent: If yes, when and what did the pharmacy advise?)  Preferred Pharmacy (with phone number or street name):[facility] [place]-Pt stated it is a Psychologist, clinical. Please confirm. Not in Epic system  Agent: Please be advised that RX refills may take up to 3 business days. We ask that you follow-up with your pharmacy.       Documentation   Gyla, [place] [id number]  Virl Axe D 11 minutes ago (10:43 AM   Per initial encounter; will route to office for final disposition.

## 2019-07-19 ENCOUNTER — Encounter: Payer: Self-pay | Admitting: Family Medicine

## 2019-07-19 ENCOUNTER — Other Ambulatory Visit: Payer: Self-pay

## 2019-07-19 MED ORDER — PENNSAID 2 % EX SOLN: 2.0000 g | Freq: Two times a day (BID) | CUTANEOUS | 3 refills | Status: DC

## 2019-08-09 ENCOUNTER — Telehealth: Payer: Self-pay

## 2019-08-09 DIAGNOSIS — I10 Essential (primary) hypertension: Secondary | ICD-10-CM

## 2019-08-09 NOTE — Telephone Encounter (Signed)
Spoke with patient. She is aware that her lab orders have been placed. She stated that she was busy at the moment and would call back to schedule a lab appointment.  Please schedule a lab appointment when the patient calls back. CRM created.

## 2019-08-09 NOTE — Addendum Note (Signed)
Addended by: [name] A on: 08/09/2019 09:00 AM   Modules accepted: Orders

## 2019-08-09 NOTE — Telephone Encounter (Signed)
I ordered the labs.

## 2019-08-09 NOTE — Telephone Encounter (Signed)
Copied from [facility] [phone]. Topic: General - Other >> Aug 09, 2019  8:04 AM Celene Kras wrote: Reason for CRM: Pt called and is requesting to have her lab orders placed before her cpe on 08/31/19. Please advise.

## 2019-08-10 ENCOUNTER — Other Ambulatory Visit: Payer: Self-pay

## 2019-08-10 ENCOUNTER — Other Ambulatory Visit (INDEPENDENT_AMBULATORY_CARE_PROVIDER_SITE_OTHER): Payer: BC Managed Care – PPO

## 2019-08-10 DIAGNOSIS — I10 Essential (primary) hypertension: Secondary | ICD-10-CM | POA: Diagnosis not present

## 2019-08-10 LAB — BASIC METABOLIC PANEL
BUN: 17 mg/dL (ref 6–23)
CO2: 28 mEq/L (ref 19–32)
Calcium: 9.6 mg/dL (ref 8.4–10.5)
Chloride: 103 mEq/L (ref 96–112)
Creatinine, Ser: 0.65 mg/dL (ref 0.40–1.20)
GFR: 91.03 mL/min (ref >60.00)
Glucose, Bld: 91 mg/dL (ref 70–99)
Potassium: 4.9 mEq/L (ref 3.5–5.1)
Sodium: 137 mEq/L (ref 135–145)

## 2019-08-10 LAB — CBC WITH DIFFERENTIAL/PLATELET
Basophils Absolute: 0 10*3/uL (ref 0.0–0.1)
Basophils Relative: 1 % (ref 0.0–3.0)
Eosinophils Absolute: 0.1 10*3/uL (ref 0.0–0.7)
Eosinophils Relative: 2.7 % (ref 0.0–5.0)
HCT: 41.5 % (ref 36.0–46.0)
Hemoglobin: 13.9 g/dL (ref 12.0–15.0)
Lymphocytes Relative: 39.7 % (ref 12.0–46.0)
Lymphs Abs: 1.8 10*3/uL (ref 0.7–4.0)
MCHC: 33.5 g/dL (ref 30.0–36.0)
MCV: 92.8 fl (ref 78.0–100.0)
Monocytes Absolute: 0.4 10*3/uL (ref 0.1–1.0)
Monocytes Relative: 8.5 % (ref 3.0–12.0)
Neutro Abs: 2.2 10*3/uL (ref 1.4–7.7)
Neutrophils Relative %: 48.1 % (ref 43.0–77.0)
Platelets: 169 10*3/uL (ref 150.0–400.0)
RBC: 4.47 Mil/uL (ref 3.87–5.11)
RDW: 14.1 % (ref 11.5–15.5)
WBC: 4.7 10*3/uL (ref 4.0–10.5)

## 2019-08-10 LAB — HEPATIC FUNCTION PANEL
ALT: 21 U/L (ref 0–35)
AST: 23 U/L (ref 0–37)
Albumin: 4.2 g/dL (ref 3.5–5.2)
Alkaline Phosphatase: 39 U/L (ref 39–117)
Bilirubin, Direct: 0.1 mg/dL (ref 0.0–0.3)
Total Bilirubin: 0.5 mg/dL (ref 0.2–1.2)
Total Protein: 6.2 g/dL (ref 6.0–8.3)

## 2019-08-10 LAB — LIPID PANEL
Cholesterol: 257 mg/dL — ABNORMAL HIGH (ref 0–200)
HDL: 80 mg/dL (ref >39.00)
LDL Cholesterol: 165 mg/dL — ABNORMAL HIGH (ref 0–99)
NonHDL: 177.27
Total CHOL/HDL Ratio: 3
Triglycerides: 63 mg/dL (ref 0.0–149.0)
VLDL: 12.6 mg/dL (ref 0.0–40.0)

## 2019-08-10 LAB — TSH: TSH: 2.88 u[IU]/mL (ref 0.35–4.50)

## 2019-08-11 ENCOUNTER — Other Ambulatory Visit: Payer: Self-pay | Admitting: Family Medicine

## 2019-08-11 ENCOUNTER — Other Ambulatory Visit: Payer: BC Managed Care – PPO

## 2019-08-11 DIAGNOSIS — Z1231 Encounter for screening mammogram for malignant neoplasm of breast: Secondary | ICD-10-CM

## 2019-08-25 ENCOUNTER — Other Ambulatory Visit: Payer: Self-pay | Admitting: Family Medicine

## 2019-08-30 ENCOUNTER — Telehealth: Payer: Self-pay | Admitting: Family Medicine

## 2019-08-30 NOTE — Telephone Encounter (Signed)
Pt called in to see if there was anything specific about her lab results. Nothing further.

## 2019-08-31 ENCOUNTER — Other Ambulatory Visit: Payer: Self-pay

## 2019-08-31 ENCOUNTER — Ambulatory Visit (INDEPENDENT_AMBULATORY_CARE_PROVIDER_SITE_OTHER): Payer: BC Managed Care – PPO | Admitting: Family Medicine

## 2019-08-31 ENCOUNTER — Encounter: Payer: Self-pay | Admitting: Family Medicine

## 2019-08-31 VITALS — BP 120/68 | HR 77 | Temp 97.2°F | Ht 62.0 in | Wt 117.6 lb

## 2019-08-31 DIAGNOSIS — Z0000 Encounter for general adult medical examination without abnormal findings: Secondary | ICD-10-CM

## 2019-08-31 DIAGNOSIS — M810 Age-related osteoporosis without current pathological fracture: Secondary | ICD-10-CM

## 2019-08-31 MED ORDER — LOSARTAN POTASSIUM 50 MG PO TABS: 50.0000 mg | ORAL_TABLET | Freq: Every day | ORAL | 3 refills | Status: DC

## 2019-08-31 NOTE — Progress Notes (Signed)
Subjective:    Patient ID: Kelly Holder, female    DOB: [birth year], 69 y.o.   MRN: [id number]  HPI Here for a well exam. She feels great. We discussed her recent labs where her LDL has risen to 165 (HDL is 80). This is the result of the keto diet she has been following for several years.    Review of Systems  Constitutional: Negative.   HENT: Negative.   Eyes: Negative.   Respiratory: Negative.   Cardiovascular: Negative.   Gastrointestinal: Negative.   Genitourinary: Negative for decreased urine volume, difficulty urinating, dyspareunia, dysuria, enuresis, flank pain, frequency, hematuria, pelvic pain and urgency.  Musculoskeletal: Negative.   Skin: Negative.   Neurological: Negative.   Psychiatric/Behavioral: Negative.        Objective:   Physical Exam Constitutional:      General: She is not in acute distress.    Appearance: She is well-developed.  HENT:     Head: Normocephalic and atraumatic.     Right Ear: External ear normal.     Left Ear: External ear normal.     Nose: Nose normal.     Mouth/Throat:     Pharynx: No oropharyngeal exudate.  Eyes:     General: No scleral icterus.    Conjunctiva/sclera: Conjunctivae normal.     Pupils: Pupils are equal, round, and reactive to light.  Neck:     Thyroid: No thyromegaly.     Vascular: No JVD.  Cardiovascular:     Rate and Rhythm: Normal rate and regular rhythm.     Heart sounds: Normal heart sounds. No murmur. No friction rub. No gallop.   Pulmonary:     Effort: Pulmonary effort is normal. No respiratory distress.     Breath sounds: Normal breath sounds. No wheezing or rales.  Chest:     Chest wall: No tenderness.  Abdominal:     General: Bowel sounds are normal. There is no distension.     Palpations: Abdomen is soft. There is no mass.     Tenderness: There is no abdominal tenderness. There is no guarding or rebound.  Musculoskeletal:        General: No tenderness. Normal range of motion.     Cervical back:  Normal range of motion and neck supple.  Lymphadenopathy:     Cervical: No cervical adenopathy.  Skin:    General: Skin is warm and dry.     Findings: No erythema or rash.  Neurological:     Mental Status: She is alert and oriented to person, place, and time.     Cranial Nerves: No cranial nerve deficit.     Motor: No abnormal muscle tone.     Coordination: Coordination normal.     Deep Tendon Reflexes: Reflexes are normal and symmetric. Reflexes normal.  Psychiatric:        Behavior: Behavior normal.        Thought Content: Thought content normal.        Judgment: Judgment normal.           Assessment & Plan:  Well exam. We discussed diet and exercise. I had recommended she start on Lipitor, but she declines today. She says she will eliminate some of the animal fats in her diet (like  Butter and eggs) instead. We agreed to check another lipid panel in 6 months to monitor this. She has a mammogram coming up in a few weeks. We ill set up a DEXA soon  [name], MD

## 2019-09-07 ENCOUNTER — Inpatient Hospital Stay: Admission: RE | Admit: 2019-09-07 | Payer: BC Managed Care – PPO | Source: Ambulatory Visit

## 2019-09-08 ENCOUNTER — Ambulatory Visit (INDEPENDENT_AMBULATORY_CARE_PROVIDER_SITE_OTHER)
Admission: RE | Admit: 2019-09-08 | Discharge: 2019-09-08 | Disposition: A | Discharge status: Home / Self Care | Payer: BC Managed Care – PPO | Source: Ambulatory Visit | Attending: Family Medicine | Admitting: Family Medicine

## 2019-09-08 ENCOUNTER — Other Ambulatory Visit: Payer: Self-pay

## 2019-09-08 DIAGNOSIS — M810 Age-related osteoporosis without current pathological fracture: Secondary | ICD-10-CM

## 2019-09-19 ENCOUNTER — Other Ambulatory Visit: Payer: Self-pay

## 2019-09-19 ENCOUNTER — Ambulatory Visit
Admission: RE | Admit: 2019-09-19 | Discharge: 2019-09-19 | Disposition: A | Discharge status: Home / Self Care | Payer: BC Managed Care – PPO | Source: Ambulatory Visit | Attending: Family Medicine | Admitting: Family Medicine

## 2019-09-19 DIAGNOSIS — Z1231 Encounter for screening mammogram for malignant neoplasm of breast: Secondary | ICD-10-CM

## 2019-11-10 DIAGNOSIS — H35372 Puckering of macula, left eye: Secondary | ICD-10-CM | POA: Diagnosis not present

## 2019-11-10 DIAGNOSIS — H43393 Other vitreous opacities, bilateral: Secondary | ICD-10-CM | POA: Diagnosis not present

## 2019-12-12 DIAGNOSIS — H40013 Open angle with borderline findings, low risk, bilateral: Secondary | ICD-10-CM | POA: Diagnosis not present

## 2020-01-12 DIAGNOSIS — H15831 Staphyloma posticum, right eye: Secondary | ICD-10-CM | POA: Diagnosis not present

## 2020-01-31 ENCOUNTER — Other Ambulatory Visit: Payer: Self-pay

## 2020-01-31 ENCOUNTER — Encounter (INDEPENDENT_AMBULATORY_CARE_PROVIDER_SITE_OTHER): Payer: BC Managed Care – PPO | Admitting: Ophthalmology

## 2020-01-31 DIAGNOSIS — H43813 Vitreous degeneration, bilateral: Secondary | ICD-10-CM | POA: Diagnosis not present

## 2020-01-31 DIAGNOSIS — H35372 Puckering of macula, left eye: Secondary | ICD-10-CM | POA: Diagnosis not present

## 2020-01-31 DIAGNOSIS — D3132 Benign neoplasm of left choroid: Secondary | ICD-10-CM | POA: Diagnosis not present

## 2020-01-31 DIAGNOSIS — H33303 Unspecified retinal break, bilateral: Secondary | ICD-10-CM

## 2020-04-30 DIAGNOSIS — H18413 Arcus senilis, bilateral: Secondary | ICD-10-CM | POA: Diagnosis not present

## 2020-04-30 DIAGNOSIS — H2513 Age-related nuclear cataract, bilateral: Secondary | ICD-10-CM | POA: Diagnosis not present

## 2020-04-30 DIAGNOSIS — H2512 Age-related nuclear cataract, left eye: Secondary | ICD-10-CM | POA: Diagnosis not present

## 2020-04-30 DIAGNOSIS — H25043 Posterior subcapsular polar age-related cataract, bilateral: Secondary | ICD-10-CM | POA: Diagnosis not present

## 2020-04-30 DIAGNOSIS — H35373 Puckering of macula, bilateral: Secondary | ICD-10-CM | POA: Diagnosis not present

## 2020-05-14 ENCOUNTER — Other Ambulatory Visit: Payer: Self-pay | Admitting: Family Medicine

## 2020-05-14 NOTE — Telephone Encounter (Signed)
Pt was last seen on 08/31/2019.   Do you want pt to schedule an appointment for follow up labs?

## 2020-06-14 ENCOUNTER — Other Ambulatory Visit: Payer: Self-pay

## 2020-06-14 ENCOUNTER — Emergency Department
Admission: EM | Admit: 2020-06-14 | Discharge: 2020-06-14 | Disposition: A | Discharge status: Home / Self Care | Payer: BC Managed Care – PPO | Source: Home / Self Care | Attending: Family Medicine | Admitting: Family Medicine

## 2020-06-14 DIAGNOSIS — S61211A Laceration without foreign body of left index finger without damage to nail, initial encounter: Secondary | ICD-10-CM | POA: Diagnosis not present

## 2020-06-14 NOTE — Discharge Instructions (Addendum)
Change dressing daily and apply Bacitracin ointment to wound.  Keep wound clean and dry.  Return for any signs of infection (or follow-up with family doctor):  Increasing redness, swelling, pain, heat, drainage, etc. °Return in 10 days for suture removal.   °

## 2020-06-14 NOTE — ED Triage Notes (Signed)
Pt c/o laceration to LT index finger after slicing it on a paper cutter about an hour ago. Pain 0/10 Unknown tdap status

## 2020-06-14 NOTE — ED Provider Notes (Signed)
[facility]    CSN: [id number] Arrival date & time: 06/14/20  1105      History   Chief Complaint Chief Complaint  Patient presents with   Laceration    Lt index    HPI Kelly Holder is a 69 y.o. female.   Patient lacerated her left index finger on a paper cutter about one hour ago.  Her last Tdap was 11/15/12.  The history is provided by the patient.  Laceration Location:  Finger Finger laceration location:  L index finger Length:  1cm Depth:  Through dermis Quality: straight   Bleeding: controlled   Time since incident:  1 hour Laceration mechanism:  Metal edge Pain details:    Quality:  Aching   Severity:  Mild   Timing:  Constant   Progression:  Unchanged Foreign body present:  No foreign bodies Worsened by:  Movement Ineffective treatments:  None tried Tetanus status:  Up to date Associated symptoms: no numbness and no swelling     Past Medical History:  Diagnosis Date   Cataract    early stage   Endometriosis    Hypertension    Migraines    Mitral valve prolapse     Patient Active Problem List   Diagnosis Date Noted   Baker's cyst of knee, right 05/16/2019   Acute lateral meniscal tear, right, initial encounter 05/16/2019   Adhesive capsulitis of left shoulder 11/17/2016   Tendinitis of left rotator cuff 02/25/2016   Essential hypertension 09/15/2010   ALLERGIC RHINITIS 09/15/2010   OTH NONSPC ABN FINDNG RAD&OTH EXM BODY STRUCTURE 09/15/2010    Past Surgical History:  Procedure Laterality Date   ABDOMINAL HYSTERECTOMY  1992   TAH and BSO    BREAST SURGERY  1993   biopsy, benign    COLONOSCOPY  06-15-14   per Dr. Carlean Purl, benign polyps, repeat in 10 yrs    MENISCUS REPAIR      OB History   No obstetric history on file.      Home Medications    Prior to Admission medications   Medication Sig Start Date End Date Taking? Authorizing Provider  Bromfenac Sodium (PROLENSA) 0.07 % SOLN Apply to eye.   Yes  [provider]  Difluprednate (DUREZOL) 0.05 % EMUL Apply to eye.   Yes [provider]  potassium chloride (KLOR-CON) 10 MEQ tablet Take 1 tablet (10 mEq total) by mouth daily. 05/14/20   Laurey Morale, MD  BESIVANCE 0.6 % SUSP Place 1 drop into the left eye 3 (three) times daily. 05/24/20   [provider]  Diclofenac Sodium (PENNSAID) 2 % SOLN Place 2 g onto the skin 2 (two) times daily. 07/19/19   Lyndal Pulley, DO  Latanoprost 0.005 % EMUL  02/07/18   [provider]  losartan (COZAAR) 50 MG tablet Take 1 tablet (50 mg total) by mouth daily. 08/31/19   Laurey Morale, MD  LUMIGAN 0.01 % SOLN 1 drop at bedtime. 01/15/20   [provider]  UNABLE TO FIND Take 1 packet by mouth daily. Med Name: shaklee vitalizer vitamins    [provider]    Family History Family History  Problem Relation Age of Onset   Hypertension Mother    Hypertension Father    Melanoma Father    Melanoma Brother    Colon cancer Maternal Aunt    Breast cancer Maternal Aunt    Breast cancer Maternal Grandmother     Social History Social History  Tobacco Use   Smoking status: Never Smoker   Smokeless tobacco: Never Used  Substance Use Topics   Alcohol use: Yes    Alcohol/week: 0.0 standard drinks    Comment: glass of wine most nights   Drug use: No     Allergies   Sulfonamide derivatives   Review of Systems Review of Systems  Skin: Positive for wound.  Neurological: Negative for numbness.  All other systems reviewed and are negative.    Physical Exam Triage Vital Signs ED Triage Vitals  Enc Vitals Group     BP 06/14/20 1118 (!) 162/90     Pulse Rate 06/14/20 1118 72     Resp 06/14/20 1118 17     Temp 06/14/20 1118 98 F (36.7 C)     Temp Source 06/14/20 1118 Oral     SpO2 06/14/20 1118 100 %     Weight --      Height --      Head Circumference --      Peak Flow --      Pain Score 06/14/20 1119 0     Pain Loc --       Pain Edu? --      Excl. in [place]? --    No data found.  Updated Vital Signs BP (!) 162/90 (BP Location: Right Arm)    Pulse 72    Temp 98 F (36.7 C) (Oral)    Resp 17    SpO2 100%   Visual Acuity Right Eye Distance:   Left Eye Distance:   Bilateral Distance:    Right Eye Near:   Left Eye Near:    Bilateral Near:     Physical Exam Vitals and nursing note reviewed.  Constitutional:      General: She is not in acute distress. HENT:     Head: Normocephalic.  Eyes:     Pupils: Pupils are equal, round, and reactive to light.  Cardiovascular:     Rate and Rhythm: Normal rate.  Pulmonary:     Effort: Pulmonary effort is normal.  Musculoskeletal:     Left hand: Laceration present. No swelling. Normal range of motion. Normal sensation. There is no disruption of two-point discrimination.       Hands:     Comments: Volar surface of the proximal phalanx of the second finger of left hand has a simple one cm long laceration  as noted on diagram.  Distal neurovascular function is intact.  Left second finger has full range of motion all joints.  Skin:    General: Skin is warm and dry.  Neurological:     Mental Status: She is alert.      UC Treatments / Results  Labs (all labs ordered are listed, but only abnormal results are displayed) Labs Reviewed - No data to display  EKG   Radiology No results found.  Procedures Procedures  Laceration Repair Discussed benefits and risks of procedure and verbal consent obtained. Using sterile technique and local anesthesia with 2% lidocaine without epinephrine, cleansed wound with Betadine followed by copious lavage with normal saline.  Wound carefully inspected for debris and foreign bodies; none found.  Wound closed with #3, 4-0 interrupted nylon sutures.  Bacitracin and non-stick sterile dressing applied.  Wound precautions explained to patient.  Return for suture removal in 10 days.   Medications Ordered in UC Medications - No data  to display  Initial Impression / Assessment and Plan / UC Course  I have reviewed the  triage vital signs and the nursing notes.  Pertinent labs & imaging results that were available during my care of the patient were reviewed by me and considered in my medical decision making (see chart for details).       Final Clinical Impressions(s) / UC Diagnoses   Final diagnoses:  Laceration of left index finger without foreign body without damage to nail, initial encounter     Discharge Instructions     Change dressing daily and apply Bacitracin ointment to wound.  Keep wound clean and dry.  Return for any signs of infection (or follow-up with family doctor):  Increasing redness, swelling, pain, heat, drainage, etc. Return in 10 days for suture removal.     ED Prescriptions    None        Kandra Nicolas, MD 06/14/20 1226

## 2020-06-17 DIAGNOSIS — H2512 Age-related nuclear cataract, left eye: Secondary | ICD-10-CM | POA: Diagnosis not present

## 2020-06-18 DIAGNOSIS — H2511 Age-related nuclear cataract, right eye: Secondary | ICD-10-CM | POA: Diagnosis not present

## 2020-06-24 ENCOUNTER — Other Ambulatory Visit: Payer: Self-pay

## 2020-06-24 ENCOUNTER — Emergency Department
Admission: EM | Admit: 2020-06-24 | Discharge: 2020-06-24 | Disposition: A | Discharge status: Home / Self Care | Payer: BC Managed Care – PPO | Source: Home / Self Care

## 2020-06-24 DIAGNOSIS — Z4802 Encounter for removal of sutures: Secondary | ICD-10-CM

## 2020-06-24 NOTE — ED Triage Notes (Signed)
Patient here for removal of three sutures from left index finger. Sutures removed, wound healing well, clean and dry. Wound care education provided.

## 2020-07-01 DIAGNOSIS — H2511 Age-related nuclear cataract, right eye: Secondary | ICD-10-CM | POA: Diagnosis not present

## 2020-08-02 DIAGNOSIS — H40013 Open angle with borderline findings, low risk, bilateral: Secondary | ICD-10-CM | POA: Diagnosis not present

## 2020-09-15 ENCOUNTER — Other Ambulatory Visit: Payer: Self-pay | Admitting: Family Medicine

## 2020-09-17 ENCOUNTER — Telehealth: Payer: Self-pay | Admitting: Family Medicine

## 2020-09-17 MED ORDER — LOSARTAN POTASSIUM 50 MG PO TABS
50.0000 mg | ORAL_TABLET | Freq: Every day | ORAL | 0 refills | Status: DC
Start: 2020-09-17 — End: 2020-10-01

## 2020-09-17 NOTE — Telephone Encounter (Signed)
rx sent to [facility] and patient notified VIA phone. Dm/cma

## 2020-09-17 NOTE — Telephone Encounter (Signed)
Pt is calling in stating that she is out of Rx losartan 50 MG and will not get the mail order until a week and would like to see if a weeks worth can be sent to her local pharmacy.  Pharm:  [facility] in [place], [place].  Pt stated that she will call back to schedule an appointment with Dr. Sarajane Jews for a CPE.

## 2020-09-17 NOTE — Telephone Encounter (Signed)
Patient has to be seen for future refill courtesy refill

## 2020-09-30 ENCOUNTER — Other Ambulatory Visit: Payer: Self-pay

## 2020-10-01 ENCOUNTER — Ambulatory Visit (INDEPENDENT_AMBULATORY_CARE_PROVIDER_SITE_OTHER): Payer: BC Managed Care – PPO | Admitting: Family Medicine

## 2020-10-01 ENCOUNTER — Encounter: Payer: Self-pay | Admitting: Family Medicine

## 2020-10-01 VITALS — BP 110/78 | HR 63 | Temp 98.0°F | Ht 62.0 in | Wt 119.8 lb

## 2020-10-01 DIAGNOSIS — Z0000 Encounter for general adult medical examination without abnormal findings: Secondary | ICD-10-CM | POA: Diagnosis not present

## 2020-10-01 LAB — CBC WITH DIFFERENTIAL/PLATELET
Basophils Absolute: 0 10*3/uL (ref 0.0–0.1)
Basophils Relative: 0.8 % (ref 0.0–3.0)
Eosinophils Absolute: 0.1 10*3/uL (ref 0.0–0.7)
Eosinophils Relative: 2.7 % (ref 0.0–5.0)
HCT: 41.8 % (ref 36.0–46.0)
Hemoglobin: 14.4 g/dL (ref 12.0–15.0)
Lymphocytes Relative: 39 % (ref 12.0–46.0)
Lymphs Abs: 1.7 10*3/uL (ref 0.7–4.0)
MCHC: 34.5 g/dL (ref 30.0–36.0)
MCV: 91.2 fl (ref 78.0–100.0)
Monocytes Absolute: 0.4 10*3/uL (ref 0.1–1.0)
Monocytes Relative: 8.9 % (ref 3.0–12.0)
Neutro Abs: 2.1 10*3/uL (ref 1.4–7.7)
Neutrophils Relative %: 48.6 % (ref 43.0–77.0)
Platelets: 177 10*3/uL (ref 150.0–400.0)
RBC: 4.58 Mil/uL (ref 3.87–5.11)
RDW: 13.8 % (ref 11.5–15.5)
WBC: 4.2 10*3/uL (ref 4.0–10.5)

## 2020-10-01 LAB — BASIC METABOLIC PANEL
BUN: 17 mg/dL (ref 6–23)
CO2: 28 mEq/L (ref 19–32)
Calcium: 10.1 mg/dL (ref 8.4–10.5)
Chloride: 103 mEq/L (ref 96–112)
Creatinine, Ser: 0.64 mg/dL (ref 0.40–1.20)
GFR: 91.24 mL/min (ref >60.00)
Glucose, Bld: 82 mg/dL (ref 70–99)
Potassium: 4.8 mEq/L (ref 3.5–5.1)
Sodium: 140 mEq/L (ref 135–145)

## 2020-10-01 LAB — URINALYSIS
Bilirubin Urine: NEGATIVE
Hgb urine dipstick: NEGATIVE
Ketones, ur: NEGATIVE
Leukocytes,Ua: NEGATIVE
Nitrite: NEGATIVE
Specific Gravity, Urine: <=1.005 — AB (ref 1.000–1.030)
Total Protein, Urine: NEGATIVE
Urine Glucose: NEGATIVE
Urobilinogen, UA: 0.2 (ref 0.0–1.0)
pH: 6 (ref 5.0–8.0)

## 2020-10-01 LAB — LIPID PANEL
Cholesterol: 255 mg/dL — ABNORMAL HIGH (ref 0–200)
HDL: 80.7 mg/dL (ref >39.00)
LDL Cholesterol: 158 mg/dL — ABNORMAL HIGH (ref 0–99)
NonHDL: 173.92
Total CHOL/HDL Ratio: 3
Triglycerides: 79 mg/dL (ref 0.0–149.0)
VLDL: 15.8 mg/dL (ref 0.0–40.0)

## 2020-10-01 LAB — HEPATIC FUNCTION PANEL
ALT: 17 U/L (ref 0–35)
AST: 23 U/L (ref 0–37)
Albumin: 4.4 g/dL (ref 3.5–5.2)
Alkaline Phosphatase: 46 U/L (ref 39–117)
Bilirubin, Direct: 0.1 mg/dL (ref 0.0–0.3)
Total Bilirubin: 0.7 mg/dL (ref 0.2–1.2)
Total Protein: 6.7 g/dL (ref 6.0–8.3)

## 2020-10-01 LAB — TSH: TSH: 2.9 u[IU]/mL (ref 0.35–4.50)

## 2020-10-01 MED ORDER — LOSARTAN POTASSIUM 50 MG PO TABS
50.0000 mg | ORAL_TABLET | Freq: Every day | ORAL | 3 refills | Status: DC
Start: 2020-10-01 — End: 2020-10-01

## 2020-10-01 MED ORDER — LOSARTAN POTASSIUM 50 MG PO TABS
50.0000 mg | ORAL_TABLET | Freq: Every day | ORAL | 0 refills | Status: DC
Start: 2020-10-01 — End: 2020-10-26

## 2020-10-01 NOTE — Addendum Note (Signed)
Addended by: [name] A on: 10/01/2020 10:19 AM   Modules accepted: Orders

## 2020-10-01 NOTE — Progress Notes (Signed)
   Subjective:    Patient ID: Kelly Holder, female    DOB: 1951/02/15, 70 y.o.   MRN: [id number]  HPI Here for a well exam. She feels well.    Review of Systems  Constitutional: Negative.   HENT: Negative.   Eyes: Negative.   Respiratory: Negative.   Cardiovascular: Negative.   Gastrointestinal: Negative.   Genitourinary: Negative for decreased urine volume, difficulty urinating, dyspareunia, dysuria, enuresis, flank pain, frequency, hematuria, pelvic pain and urgency.  Musculoskeletal: Negative.   Skin: Negative.   Neurological: Negative.   Psychiatric/Behavioral: Negative.        Objective:   Physical Exam Constitutional:      General: She is not in acute distress.    Appearance: Normal appearance. She is well-developed and well-nourished.  HENT:     Head: Normocephalic and atraumatic.     Right Ear: External ear normal.     Left Ear: External ear normal.     Nose: Nose normal.     Mouth/Throat:     Mouth: Oropharynx is clear and moist.     Pharynx: No oropharyngeal exudate.  Eyes:     General: No scleral icterus.    Extraocular Movements: EOM normal.     Conjunctiva/sclera: Conjunctivae normal.     Pupils: Pupils are equal, round, and reactive to light.  Neck:     Thyroid: No thyromegaly.     Vascular: No JVD.  Cardiovascular:     Rate and Rhythm: Normal rate and regular rhythm.     Pulses: Intact distal pulses.     Heart sounds: Normal heart sounds. No murmur heard. No friction rub. No gallop.   Pulmonary:     Effort: Pulmonary effort is normal. No respiratory distress.     Breath sounds: Normal breath sounds. No wheezing or rales.  Chest:     Chest wall: No tenderness.  Abdominal:     General: Bowel sounds are normal. There is no distension.     Palpations: Abdomen is soft. There is no mass.     Tenderness: There is no abdominal tenderness. There is no guarding or rebound.  Musculoskeletal:        General: No tenderness or edema. Normal range of motion.      Cervical back: Normal range of motion and neck supple.  Lymphadenopathy:     Cervical: No cervical adenopathy.  Skin:    General: Skin is warm and dry.     Findings: No erythema or rash.  Neurological:     Mental Status: She is alert and oriented to person, place, and time.     Cranial Nerves: No cranial nerve deficit.     Motor: No abnormal muscle tone.     Coordination: Coordination normal.     Deep Tendon Reflexes: Reflexes are normal and symmetric. Reflexes normal.  Psychiatric:        Mood and Affect: Mood and affect normal.        Behavior: Behavior normal.        Thought Content: Thought content normal.        Judgment: Judgment normal.           Assessment & Plan:  Well exam. We discussed diet and exercise. Get fasting labs. [name], MD

## 2020-10-26 ENCOUNTER — Telehealth: Payer: Self-pay

## 2020-10-26 ENCOUNTER — Other Ambulatory Visit: Payer: Self-pay

## 2020-10-26 MED ORDER — LOSARTAN POTASSIUM 50 MG PO TABS
50.0000 mg | ORAL_TABLET | Freq: Every day | ORAL | 3 refills | Status: DC
Start: 2020-10-26 — End: 2021-10-03

## 2020-10-26 NOTE — Telephone Encounter (Signed)
Yes she should stay on this long term. Please refill for one year

## 2020-10-26 NOTE — Telephone Encounter (Signed)
Refill sent.

## 2020-10-26 NOTE — Telephone Encounter (Signed)
Pt pharmacy wants to know if patient should be taking Losartan 50 mg long term, if so is it ok to sent a refill. Last refill sent was for 15 tablets

## 2020-11-28 DIAGNOSIS — H40051 Ocular hypertension, right eye: Secondary | ICD-10-CM | POA: Diagnosis not present

## 2020-12-09 DIAGNOSIS — D1801 Hemangioma of skin and subcutaneous tissue: Secondary | ICD-10-CM | POA: Diagnosis not present

## 2020-12-09 DIAGNOSIS — L820 Inflamed seborrheic keratosis: Secondary | ICD-10-CM | POA: Diagnosis not present

## 2021-02-18 ENCOUNTER — Encounter (INDEPENDENT_AMBULATORY_CARE_PROVIDER_SITE_OTHER): Payer: BC Managed Care – PPO | Admitting: Ophthalmology

## 2021-02-18 ENCOUNTER — Other Ambulatory Visit: Payer: Self-pay

## 2021-02-18 DIAGNOSIS — H35033 Hypertensive retinopathy, bilateral: Secondary | ICD-10-CM

## 2021-02-18 DIAGNOSIS — I10 Essential (primary) hypertension: Secondary | ICD-10-CM | POA: Diagnosis not present

## 2021-02-18 DIAGNOSIS — H35372 Puckering of macula, left eye: Secondary | ICD-10-CM

## 2021-02-18 DIAGNOSIS — H43813 Vitreous degeneration, bilateral: Secondary | ICD-10-CM

## 2021-02-18 DIAGNOSIS — H33303 Unspecified retinal break, bilateral: Secondary | ICD-10-CM

## 2021-02-18 DIAGNOSIS — D3132 Benign neoplasm of left choroid: Secondary | ICD-10-CM

## 2021-03-27 ENCOUNTER — Other Ambulatory Visit: Payer: Self-pay | Admitting: Family Medicine

## 2021-03-27 DIAGNOSIS — Z1231 Encounter for screening mammogram for malignant neoplasm of breast: Secondary | ICD-10-CM

## 2021-04-09 ENCOUNTER — Ambulatory Visit
Admission: RE | Admit: 2021-04-09 | Discharge: 2021-04-09 | Disposition: A | Discharge status: Home / Self Care | Payer: BC Managed Care – PPO | Source: Ambulatory Visit | Attending: Family Medicine | Admitting: Family Medicine

## 2021-04-09 ENCOUNTER — Other Ambulatory Visit: Payer: Self-pay

## 2021-04-09 DIAGNOSIS — Z1231 Encounter for screening mammogram for malignant neoplasm of breast: Secondary | ICD-10-CM

## 2021-04-11 ENCOUNTER — Other Ambulatory Visit: Payer: Self-pay | Admitting: Family Medicine

## 2021-04-29 DIAGNOSIS — H35373 Puckering of macula, bilateral: Secondary | ICD-10-CM | POA: Diagnosis not present

## 2021-04-29 DIAGNOSIS — Z961 Presence of intraocular lens: Secondary | ICD-10-CM | POA: Diagnosis not present

## 2021-04-29 DIAGNOSIS — H401131 Primary open-angle glaucoma, bilateral, mild stage: Secondary | ICD-10-CM | POA: Diagnosis not present

## 2021-04-29 DIAGNOSIS — H26493 Other secondary cataract, bilateral: Secondary | ICD-10-CM | POA: Diagnosis not present

## 2021-04-29 DIAGNOSIS — H26491 Other secondary cataract, right eye: Secondary | ICD-10-CM | POA: Diagnosis not present

## 2021-04-29 DIAGNOSIS — H18413 Arcus senilis, bilateral: Secondary | ICD-10-CM | POA: Diagnosis not present

## 2021-05-29 DIAGNOSIS — H26492 Other secondary cataract, left eye: Secondary | ICD-10-CM | POA: Diagnosis not present

## 2021-07-29 DIAGNOSIS — H40013 Open angle with borderline findings, low risk, bilateral: Secondary | ICD-10-CM | POA: Diagnosis not present

## 2021-09-26 ENCOUNTER — Other Ambulatory Visit: Payer: Self-pay | Admitting: Family Medicine

## 2021-10-03 ENCOUNTER — Ambulatory Visit (INDEPENDENT_AMBULATORY_CARE_PROVIDER_SITE_OTHER): Payer: BC Managed Care – PPO | Admitting: Family Medicine

## 2021-10-03 ENCOUNTER — Encounter: Payer: Self-pay | Admitting: Family Medicine

## 2021-10-03 VITALS — BP 118/70 | HR 66 | Temp 97.7°F | Ht 62.0 in | Wt 117.2 lb

## 2021-10-03 DIAGNOSIS — Z0000 Encounter for general adult medical examination without abnormal findings: Secondary | ICD-10-CM | POA: Diagnosis not present

## 2021-10-03 LAB — CBC WITH DIFFERENTIAL/PLATELET
Basophils Absolute: 0 10*3/uL (ref 0.0–0.1)
Basophils Relative: 0.8 % (ref 0.0–3.0)
Eosinophils Absolute: 0.1 10*3/uL (ref 0.0–0.7)
Eosinophils Relative: 2.7 % (ref 0.0–5.0)
HCT: 41.2 % (ref 36.0–46.0)
Hemoglobin: 13.8 g/dL (ref 12.0–15.0)
Lymphocytes Relative: 33.3 % (ref 12.0–46.0)
Lymphs Abs: 1.6 10*3/uL (ref 0.7–4.0)
MCHC: 33.4 g/dL (ref 30.0–36.0)
MCV: 92.8 fl (ref 78.0–100.0)
Monocytes Absolute: 0.5 10*3/uL (ref 0.1–1.0)
Monocytes Relative: 9.1 % (ref 3.0–12.0)
Neutro Abs: 2.7 10*3/uL (ref 1.4–7.7)
Neutrophils Relative %: 54.1 % (ref 43.0–77.0)
Platelets: 173 10*3/uL (ref 150.0–400.0)
RBC: 4.44 Mil/uL (ref 3.87–5.11)
RDW: 13.3 % (ref 11.5–15.5)
WBC: 5 10*3/uL (ref 4.0–10.5)

## 2021-10-03 LAB — HEPATIC FUNCTION PANEL
ALT: 18 U/L (ref 0–35)
AST: 26 U/L (ref 0–37)
Albumin: 4.4 g/dL (ref 3.5–5.2)
Alkaline Phosphatase: 44 U/L (ref 39–117)
Bilirubin, Direct: 0.1 mg/dL (ref 0.0–0.3)
Total Bilirubin: 0.7 mg/dL (ref 0.2–1.2)
Total Protein: 6.8 g/dL (ref 6.0–8.3)

## 2021-10-03 LAB — LIPID PANEL
Cholesterol: 249 mg/dL — ABNORMAL HIGH (ref 0–200)
HDL: 82.5 mg/dL (ref >39.00)
LDL Cholesterol: 153 mg/dL — ABNORMAL HIGH (ref 0–99)
NonHDL: 166.51
Total CHOL/HDL Ratio: 3
Triglycerides: 66 mg/dL (ref 0.0–149.0)
VLDL: 13.2 mg/dL (ref 0.0–40.0)

## 2021-10-03 LAB — BASIC METABOLIC PANEL
BUN: 14 mg/dL (ref 6–23)
CO2: 28 mEq/L (ref 19–32)
Calcium: 10 mg/dL (ref 8.4–10.5)
Chloride: 100 mEq/L (ref 96–112)
Creatinine, Ser: 0.66 mg/dL (ref 0.40–1.20)
GFR: 89.93 mL/min (ref >60.00)
Glucose, Bld: 77 mg/dL (ref 70–99)
Potassium: 5.2 mEq/L — ABNORMAL HIGH (ref 3.5–5.1)
Sodium: 136 mEq/L (ref 135–145)

## 2021-10-03 LAB — TSH: TSH: 1.87 u[IU]/mL (ref 0.35–5.50)

## 2021-10-03 LAB — HEMOGLOBIN A1C: Hgb A1c MFr Bld: 5.3 % (ref 4.6–6.5)

## 2021-10-03 MED ORDER — LOSARTAN POTASSIUM 50 MG PO TABS: 50.0000 mg | ORAL_TABLET | Freq: Every day | ORAL | 3 refills | Status: DC

## 2021-10-03 NOTE — Progress Notes (Signed)
? ?  Subjective:  ? ? Patient ID: Kelly Holder, female    DOB: [birth year], 71 y.o.   MRN: [id number] ? ?HPI ?Here for a well exam. She feels great. Her glaucoma is stable and she is S/P cataract surgery.  ? ? ?Review of Systems  ?Constitutional: Negative.   ?HENT: Negative.    ?Eyes: Negative.   ?Respiratory: Negative.    ?Cardiovascular: Negative.   ?Gastrointestinal: Negative.   ?Genitourinary:  Negative for decreased urine volume, difficulty urinating, dyspareunia, dysuria, enuresis, flank pain, frequency, hematuria, pelvic pain and urgency.  ?Musculoskeletal: Negative.   ?Skin: Negative.   ?Neurological: Negative.  Negative for headaches.  ?Psychiatric/Behavioral: Negative.    ? ?   ?Objective:  ? Physical Exam ?Constitutional:   ?   General: She is not in acute distress. ?   Appearance: Normal appearance. She is well-developed.  ?HENT:  ?   Head: Normocephalic and atraumatic.  ?   Right Ear: External ear normal.  ?   Left Ear: External ear normal.  ?   Nose: Nose normal.  ?   Mouth/Throat:  ?   Pharynx: No oropharyngeal exudate.  ?Eyes:  ?   General: No scleral icterus. ?   Conjunctiva/sclera: Conjunctivae normal.  ?   Pupils: Pupils are equal, round, and reactive to light.  ?Neck:  ?   Thyroid: No thyromegaly.  ?   Vascular: No JVD.  ?Cardiovascular:  ?   Rate and Rhythm: Normal rate and regular rhythm.  ?   Heart sounds: Normal heart sounds. No murmur heard. ?  No friction rub. No gallop.  ?Pulmonary:  ?   Effort: Pulmonary effort is normal. No respiratory distress.  ?   Breath sounds: Normal breath sounds. No wheezing or rales.  ?Chest:  ?   Chest wall: No tenderness.  ?Abdominal:  ?   General: Bowel sounds are normal. There is no distension.  ?   Palpations: Abdomen is soft. There is no mass.  ?   Tenderness: There is no abdominal tenderness. There is no guarding or rebound.  ?Musculoskeletal:     ?   General: No tenderness. Normal range of motion.  ?   Cervical back: Normal range of motion and neck supple.   ?Lymphadenopathy:  ?   Cervical: No cervical adenopathy.  ?Skin: ?   General: Skin is warm and dry.  ?   Findings: No erythema or rash.  ?Neurological:  ?   Mental Status: She is alert and oriented to person, place, and time.  ?   Cranial Nerves: No cranial nerve deficit.  ?   Motor: No abnormal muscle tone.  ?   Coordination: Coordination normal.  ?   Deep Tendon Reflexes: Reflexes are normal and symmetric. Reflexes normal.  ?Psychiatric:     ?   Behavior: Behavior normal.     ?   Thought Content: Thought content normal.     ?   Judgment: Judgment normal.  ? ? ? ? ? ?   ?Assessment & Plan:  ?Well exam. We discussed diet and exercise. Get fasting labs.  ?[name], MD ? ? ?

## 2021-10-08 ENCOUNTER — Other Ambulatory Visit: Payer: Self-pay

## 2021-10-08 DIAGNOSIS — I10 Essential (primary) hypertension: Secondary | ICD-10-CM

## 2021-11-28 DIAGNOSIS — H40053 Ocular hypertension, bilateral: Secondary | ICD-10-CM | POA: Diagnosis not present

## 2021-12-12 DIAGNOSIS — L814 Other melanin hyperpigmentation: Secondary | ICD-10-CM | POA: Diagnosis not present

## 2021-12-12 DIAGNOSIS — L821 Other seborrheic keratosis: Secondary | ICD-10-CM | POA: Diagnosis not present

## 2021-12-12 DIAGNOSIS — D225 Melanocytic nevi of trunk: Secondary | ICD-10-CM | POA: Diagnosis not present

## 2022-01-14 ENCOUNTER — Other Ambulatory Visit: Payer: Self-pay | Admitting: Family Medicine

## 2022-01-14 DIAGNOSIS — I10 Essential (primary) hypertension: Secondary | ICD-10-CM

## 2022-01-27 ENCOUNTER — Encounter (INDEPENDENT_AMBULATORY_CARE_PROVIDER_SITE_OTHER): Payer: BC Managed Care – PPO | Admitting: Ophthalmology

## 2022-02-16 ENCOUNTER — Encounter (INDEPENDENT_AMBULATORY_CARE_PROVIDER_SITE_OTHER): Payer: BC Managed Care – PPO | Admitting: Ophthalmology

## 2022-02-16 DIAGNOSIS — H35362 Drusen (degenerative) of macula, left eye: Secondary | ICD-10-CM

## 2022-02-16 DIAGNOSIS — H43813 Vitreous degeneration, bilateral: Secondary | ICD-10-CM

## 2022-02-16 DIAGNOSIS — I10 Essential (primary) hypertension: Secondary | ICD-10-CM | POA: Diagnosis not present

## 2022-02-16 DIAGNOSIS — H33303 Unspecified retinal break, bilateral: Secondary | ICD-10-CM

## 2022-02-16 DIAGNOSIS — D3132 Benign neoplasm of left choroid: Secondary | ICD-10-CM

## 2022-02-16 DIAGNOSIS — H35033 Hypertensive retinopathy, bilateral: Secondary | ICD-10-CM | POA: Diagnosis not present

## 2022-02-18 ENCOUNTER — Encounter (INDEPENDENT_AMBULATORY_CARE_PROVIDER_SITE_OTHER): Payer: Medicare Other | Admitting: Ophthalmology

## 2022-03-10 ENCOUNTER — Other Ambulatory Visit: Payer: Self-pay | Admitting: Family Medicine

## 2022-07-11 ENCOUNTER — Other Ambulatory Visit: Payer: Self-pay | Admitting: Family Medicine

## 2022-10-28 ENCOUNTER — Ambulatory Visit (INDEPENDENT_AMBULATORY_CARE_PROVIDER_SITE_OTHER): Payer: No Typology Code available for payment source | Admitting: Family Medicine

## 2022-10-28 ENCOUNTER — Encounter: Payer: Self-pay | Admitting: Family Medicine

## 2022-10-28 VITALS — BP 124/78 | Temp 97.7°F | Wt 122.2 lb

## 2022-10-28 DIAGNOSIS — Z136 Encounter for screening for cardiovascular disorders: Secondary | ICD-10-CM

## 2022-10-28 DIAGNOSIS — Z0000 Encounter for general adult medical examination without abnormal findings: Secondary | ICD-10-CM | POA: Diagnosis not present

## 2022-10-28 DIAGNOSIS — Z23 Encounter for immunization: Secondary | ICD-10-CM

## 2022-10-28 LAB — HEPATIC FUNCTION PANEL
ALT: 19 U/L (ref 0–35)
AST: 26 U/L (ref 0–37)
Albumin: 4.4 g/dL (ref 3.5–5.2)
Alkaline Phosphatase: 44 U/L (ref 39–117)
Bilirubin, Direct: 0.1 mg/dL (ref 0.0–0.3)
Total Bilirubin: 0.5 mg/dL (ref 0.2–1.2)
Total Protein: 7.1 g/dL (ref 6.0–8.3)

## 2022-10-28 LAB — CBC WITH DIFFERENTIAL/PLATELET
Basophils Absolute: 0.1 10*3/uL (ref 0.0–0.1)
Basophils Relative: 1 % (ref 0.0–3.0)
Eosinophils Absolute: 0.1 10*3/uL (ref 0.0–0.7)
Eosinophils Relative: 1.3 % (ref 0.0–5.0)
HCT: 43.5 % (ref 36.0–46.0)
Hemoglobin: 14.5 g/dL (ref 12.0–15.0)
Lymphocytes Relative: 32.5 % (ref 12.0–46.0)
Lymphs Abs: 1.7 10*3/uL (ref 0.7–4.0)
MCHC: 33.4 g/dL (ref 30.0–36.0)
MCV: 92.3 fl (ref 78.0–100.0)
Monocytes Absolute: 0.4 10*3/uL (ref 0.1–1.0)
Monocytes Relative: 8.1 % (ref 3.0–12.0)
Neutro Abs: 3 10*3/uL (ref 1.4–7.7)
Neutrophils Relative %: 57.1 % (ref 43.0–77.0)
Platelets: 204 10*3/uL (ref 150.0–400.0)
RBC: 4.71 Mil/uL (ref 3.87–5.11)
RDW: 13.6 % (ref 11.5–15.5)
WBC: 5.3 10*3/uL (ref 4.0–10.5)

## 2022-10-28 LAB — BASIC METABOLIC PANEL
BUN: 15 mg/dL (ref 6–23)
CO2: 28 mEq/L (ref 19–32)
Calcium: 10 mg/dL (ref 8.4–10.5)
Chloride: 103 mEq/L (ref 96–112)
Creatinine, Ser: 0.7 mg/dL (ref 0.40–1.20)
GFR: 88 mL/min (ref >60.00)
Glucose, Bld: 95 mg/dL (ref 70–99)
Potassium: 4.8 mEq/L (ref 3.5–5.1)
Sodium: 139 mEq/L (ref 135–145)

## 2022-10-28 LAB — HEMOGLOBIN A1C: Hgb A1c MFr Bld: 5.4 % (ref 4.6–6.5)

## 2022-10-28 LAB — LIPID PANEL
Cholesterol: 235 mg/dL — ABNORMAL HIGH (ref 0–200)
HDL: 87 mg/dL (ref >39.00)
LDL Cholesterol: 137 mg/dL — ABNORMAL HIGH (ref 0–99)
NonHDL: 148.24
Total CHOL/HDL Ratio: 3
Triglycerides: 56 mg/dL (ref 0.0–149.0)
VLDL: 11.2 mg/dL (ref 0.0–40.0)

## 2022-10-28 LAB — TSH: TSH: 2.18 u[IU]/mL (ref 0.35–5.50)

## 2022-10-28 MED ORDER — LOSARTAN POTASSIUM 50 MG PO TABS: 50.0000 mg | ORAL_TABLET | Freq: Every day | ORAL | 0 refills | Status: DC

## 2022-10-28 MED ORDER — LOSARTAN POTASSIUM 50 MG PO TABS: 50.0000 mg | ORAL_TABLET | Freq: Every day | ORAL | 3 refills | Status: DC

## 2022-10-28 NOTE — Addendum Note (Signed)
Addended by: Wyvonne Lenz on: 10/28/2022 11:38 AM   Modules accepted: Orders

## 2022-10-28 NOTE — Progress Notes (Signed)
   Subjective:    Patient ID: Kelly Holder, female    DOB: 12-19-1950, 72 y.o.   MRN: [id number]  HPI Here for a well exam. She is doing great. She is still on a keto diet and she exercises regularly.    Review of Systems  Constitutional: Negative.   HENT: Negative.    Eyes: Negative.   Respiratory: Negative.    Cardiovascular: Negative.   Gastrointestinal: Negative.   Genitourinary:  Negative for decreased urine volume, difficulty urinating, dyspareunia, dysuria, enuresis, flank pain, frequency, hematuria, pelvic pain and urgency.  Musculoskeletal: Negative.   Skin: Negative.   Neurological: Negative.  Negative for headaches.  Psychiatric/Behavioral: Negative.         Objective:   Physical Exam Constitutional:      General: She is not in acute distress.    Appearance: Normal appearance. She is well-developed.  HENT:     Head: Normocephalic and atraumatic.     Right Ear: External ear normal.     Left Ear: External ear normal.     Nose: Nose normal.     Mouth/Throat:     Pharynx: No oropharyngeal exudate.  Eyes:     General: No scleral icterus.    Conjunctiva/sclera: Conjunctivae normal.     Pupils: Pupils are equal, round, and reactive to light.  Neck:     Thyroid: No thyromegaly.     Vascular: No JVD.  Cardiovascular:     Rate and Rhythm: Normal rate and regular rhythm.     Pulses: Normal pulses.     Heart sounds: Normal heart sounds. No murmur heard.    No friction rub. No gallop.  Pulmonary:     Effort: Pulmonary effort is normal. No respiratory distress.     Breath sounds: Normal breath sounds. No wheezing or rales.  Chest:     Chest wall: No tenderness.  Abdominal:     General: Bowel sounds are normal. There is no distension.     Palpations: Abdomen is soft. There is no mass.     Tenderness: There is no abdominal tenderness. There is no guarding or rebound.  Musculoskeletal:        General: No tenderness. Normal range of motion.     Cervical back: Normal  range of motion and neck supple.  Lymphadenopathy:     Cervical: No cervical adenopathy.  Skin:    General: Skin is warm and dry.     Findings: No erythema or rash.  Neurological:     General: No focal deficit present.     Mental Status: She is alert and oriented to person, place, and time.     Cranial Nerves: No cranial nerve deficit.     Motor: No abnormal muscle tone.     Coordination: Coordination normal.     Deep Tendon Reflexes: Reflexes are normal and symmetric. Reflexes normal.  Psychiatric:        Mood and Affect: Mood normal.        Behavior: Behavior normal.        Thought Content: Thought content normal.        Judgment: Judgment normal.           Assessment & Plan:  Well exam. We discussed diet and exercise. Get fasting labs. [name], MD

## 2022-11-10 ENCOUNTER — Telehealth: Payer: Self-pay | Admitting: Family Medicine

## 2022-11-10 NOTE — Telephone Encounter (Addendum)
Pt is calling and has new pharm and losartan (COZAAR) 50 MG tablet  #90 needs to be sent to  [facility] - [place], [place] - [address] AT Portal to Registered [employer] Sites Phone: [phone]  Fax: [phone]

## 2022-11-11 MED ORDER — LOSARTAN POTASSIUM 50 MG PO TABS: 50.0000 mg | ORAL_TABLET | Freq: Every day | ORAL | 3 refills | Status: DC

## 2022-11-11 NOTE — Telephone Encounter (Signed)
Rx sent 

## 2022-11-16 ENCOUNTER — Other Ambulatory Visit: Payer: Self-pay | Admitting: Family Medicine

## 2022-11-16 DIAGNOSIS — Z1231 Encounter for screening mammogram for malignant neoplasm of breast: Secondary | ICD-10-CM

## 2022-12-07 ENCOUNTER — Ambulatory Visit
Admission: RE | Admit: 2022-12-07 | Discharge: 2022-12-07 | Disposition: A | Discharge status: Home / Self Care | Payer: No Typology Code available for payment source | Source: Ambulatory Visit | Attending: Family Medicine | Admitting: Family Medicine

## 2022-12-07 DIAGNOSIS — Z1231 Encounter for screening mammogram for malignant neoplasm of breast: Secondary | ICD-10-CM

## 2023-02-12 ENCOUNTER — Encounter (INDEPENDENT_AMBULATORY_CARE_PROVIDER_SITE_OTHER): Payer: No Typology Code available for payment source | Admitting: Ophthalmology

## 2023-02-12 DIAGNOSIS — H35372 Puckering of macula, left eye: Secondary | ICD-10-CM | POA: Diagnosis not present

## 2023-02-12 DIAGNOSIS — H43813 Vitreous degeneration, bilateral: Secondary | ICD-10-CM

## 2023-02-12 DIAGNOSIS — I10 Essential (primary) hypertension: Secondary | ICD-10-CM | POA: Diagnosis not present

## 2023-02-12 DIAGNOSIS — H33333 Multiple defects of retina without detachment, bilateral: Secondary | ICD-10-CM | POA: Diagnosis not present

## 2023-02-12 DIAGNOSIS — H35033 Hypertensive retinopathy, bilateral: Secondary | ICD-10-CM | POA: Diagnosis not present

## 2023-02-12 DIAGNOSIS — D3132 Benign neoplasm of left choroid: Secondary | ICD-10-CM

## 2023-02-15 ENCOUNTER — Encounter (INDEPENDENT_AMBULATORY_CARE_PROVIDER_SITE_OTHER): Payer: Medicare Other | Admitting: Ophthalmology

## 2023-02-19 ENCOUNTER — Encounter (INDEPENDENT_AMBULATORY_CARE_PROVIDER_SITE_OTHER): Payer: No Typology Code available for payment source | Admitting: Ophthalmology

## 2023-04-13 IMAGING — MG MM DIGITAL SCREENING BILAT W/ TOMO AND CAD
8 series · 9 of 24 positions shown · non-contrast
Comparison: Previous exam(s).

CLINICAL DATA: Screening.

EXAM:
DIGITAL SCREENING BILATERAL MAMMOGRAM WITH TOMOSYNTHESIS AND CAD
TECHNIQUE: Bilateral screening digital craniocaudal and mediolateral oblique
mammograms were obtained. Bilateral screening digital breast
tomosynthesis was performed. The images were evaluated with
computer-aided detection.

[L CC synth-2D]
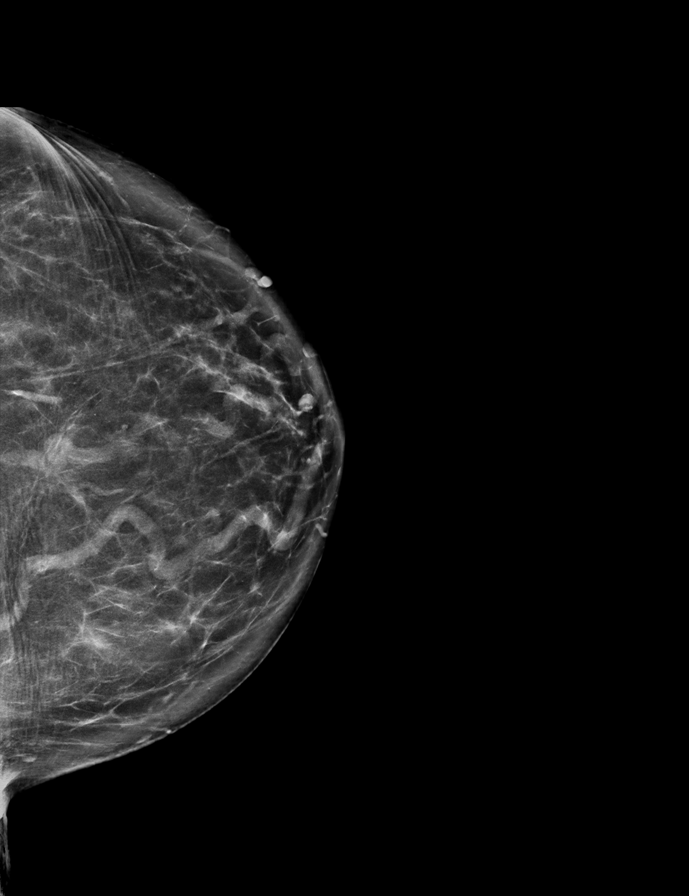

[R MLO synth-2D]
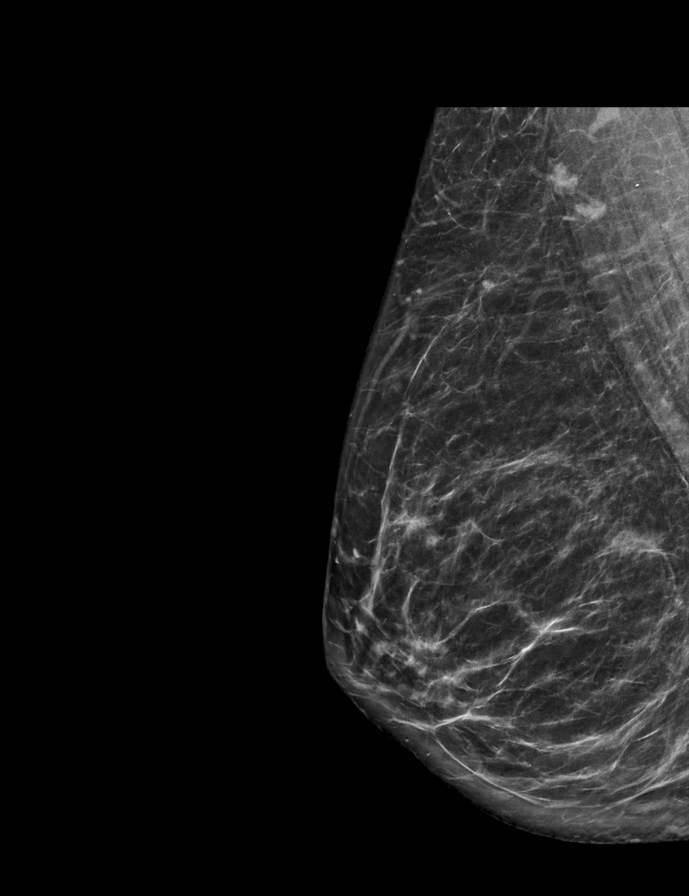

[L MLO synth-2D]
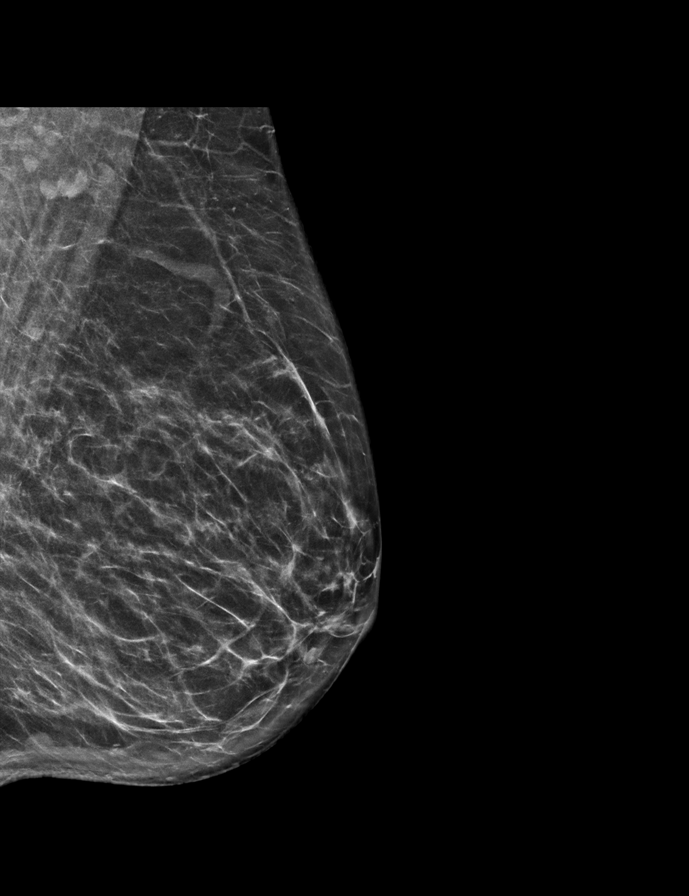

[R CC synth-2D]
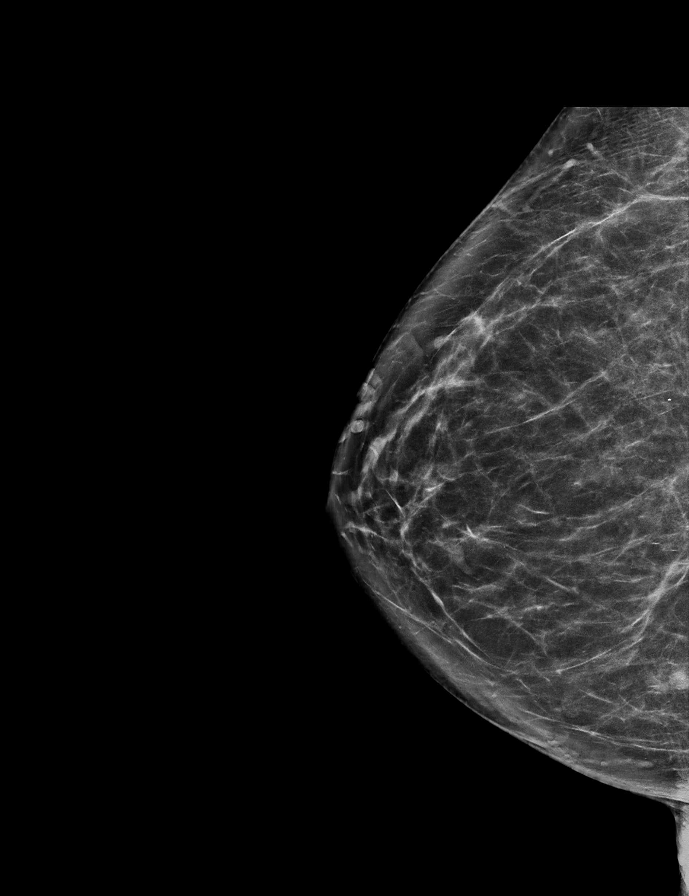

[R MLO tomo · 2 of 71 frames shown]
[frame 23/71]
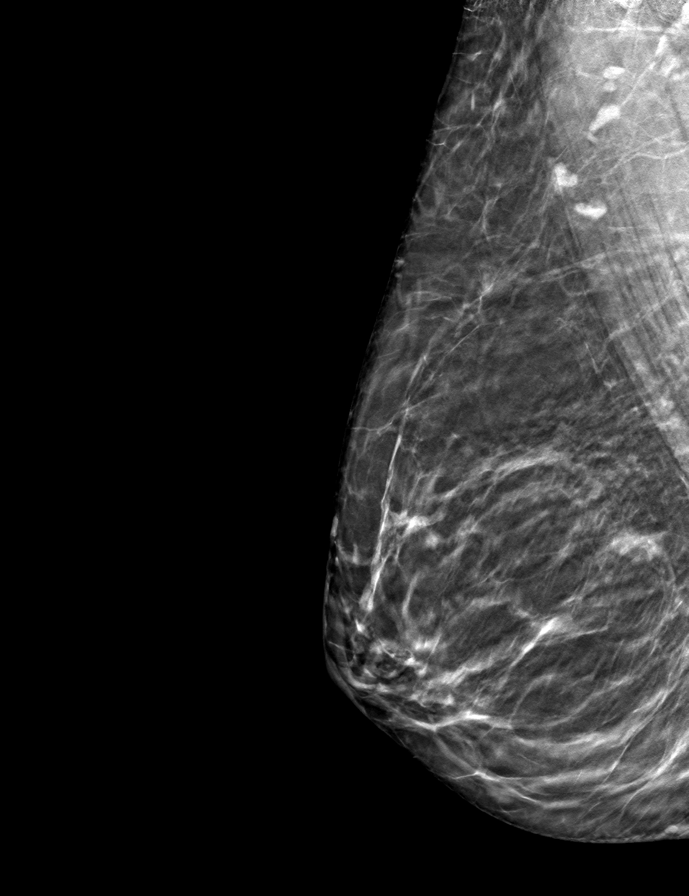
[frame 36/71]
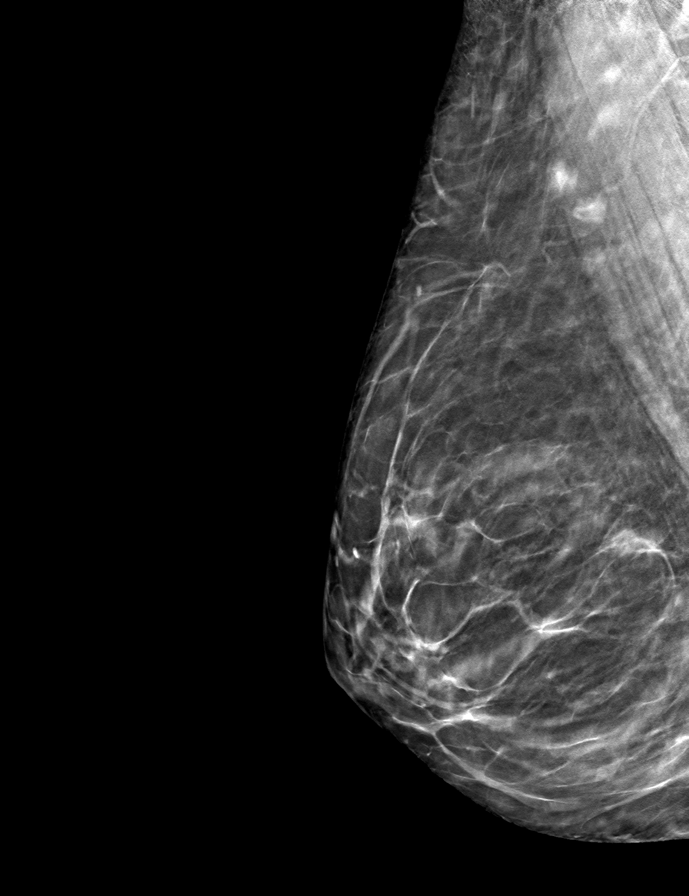

[R CC tomo · tomo slice 37/72.0]
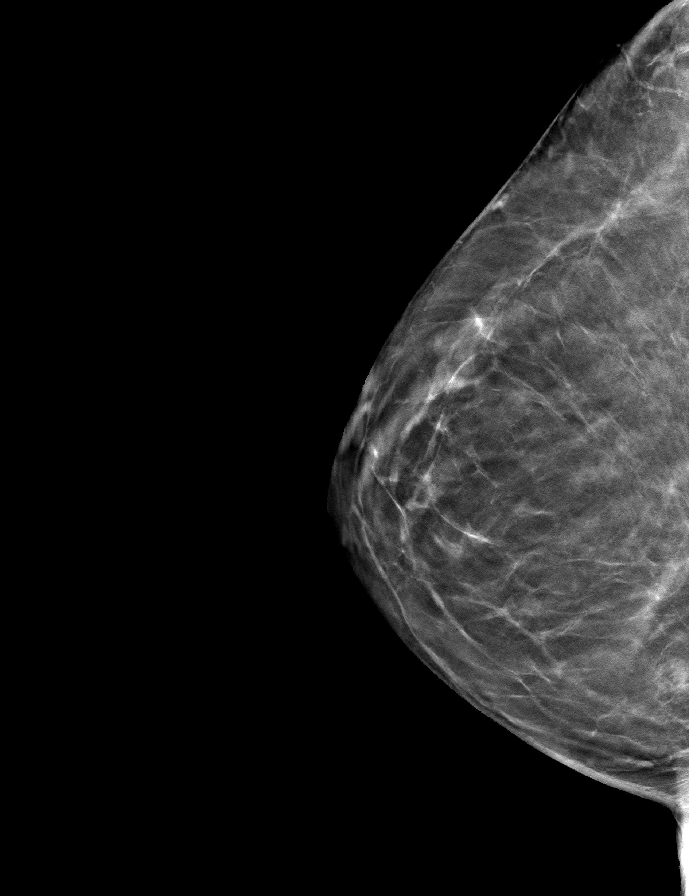

[L MLO tomo · tomo slice 34/67.0]
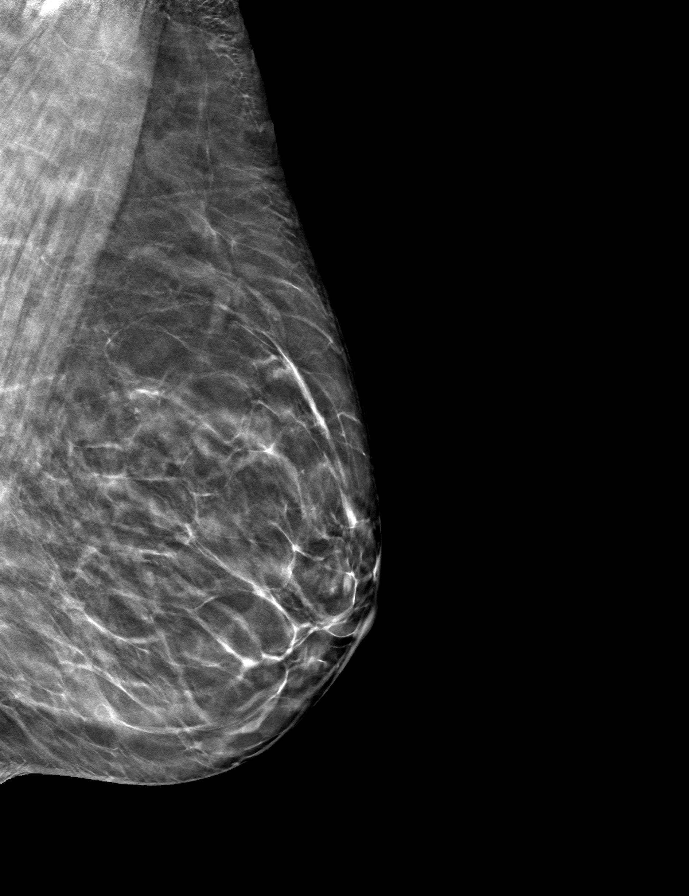

[L CC tomo · tomo slice 37/74.0]
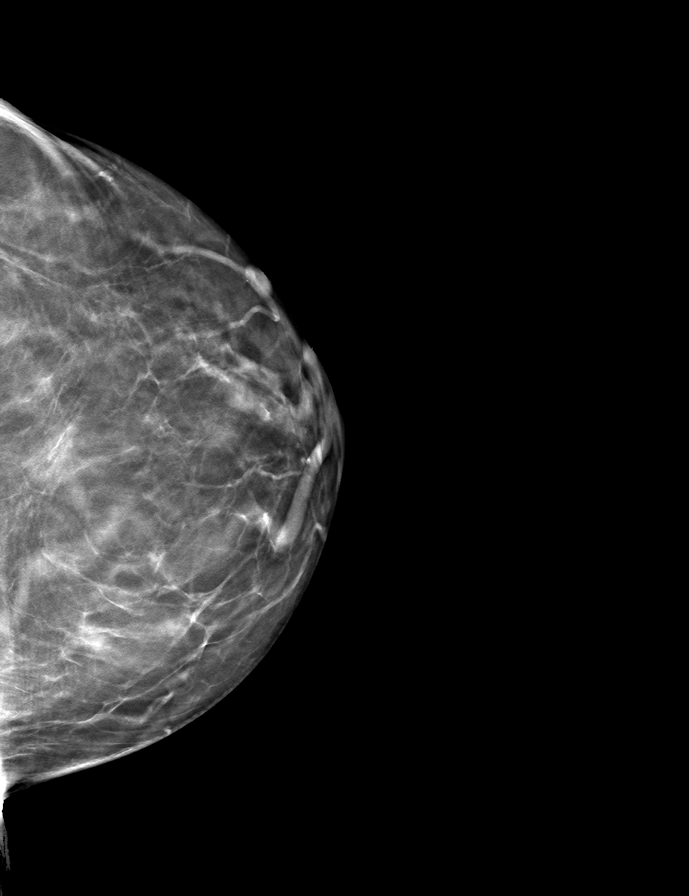

[9 of 24 positions shown; findings below may reference images not displayed]

ACR Breast Density Category b: There are scattered areas of
fibroglandular density.
FINDINGS: There are no findings suspicious for malignancy.
IMPRESSION: No mammographic evidence of malignancy. A result letter of this
screening mammogram will be mailed directly to the patient.

RECOMMENDATION:
Screening mammogram in one year. (Code:51-O-LD2)

BI-RADS CATEGORY  1: Negative.

## 2023-08-27 NOTE — Progress Notes (Unsigned)
Kelly Holder [address] [place] [address] Phone: [phone] Subjective:   Kelly Holder, am serving as a scribe for Dr. Antoine Holder.  I'm seeing this patient by the request  of:  Kelly Salisbury, MD  CC: Left thumb pain, right middle finger pain  [id number]  Kelly Holder is a 73 y.o. female coming in with complaint of L CMC jt pain. Patient states that her pain occurs with use of L hand especially pinching.   Also c/o  pain in R hand middle finger DIP joint. She twisted finger a year ago. Notes chronic swelling.        Past Medical History:  Diagnosis Date   Cataract    early stage   Endometriosis    Hypertension    Migraines    Mitral valve prolapse    Past Surgical History:  Procedure Laterality Date   ABDOMINAL HYSTERECTOMY  08/03/1990   TAH and BSO    BREAST BIOPSY Right    BREAST SURGERY  08/04/1991   biopsy, benign    COLONOSCOPY  06/15/2014   per Dr. Leone Payor, benign polyps, repeat in 10 yrs    MENISCUS REPAIR     Social History   Socioeconomic History   Marital status: Married    Spouse name: Not on file   Number of children: Not on file   Years of education: Not on file   Highest education level: Not on file  Occupational History   Not on file  Tobacco Use   Smoking status: Never   Smokeless tobacco: Never  Substance and Sexual Activity   Alcohol use: Yes    Alcohol/week: 0.0 standard drinks of alcohol    Comment: glass of wine most nights   Drug use: No   Sexual activity: Not on file  Other Topics Concern   Not on file  Social History Narrative   Not on file   Social Drivers of Health   Financial Resource Strain: Not on file  Food Insecurity: Not on file  Transportation Needs: Not on file  Physical Activity: Not on file  Stress: Not on file (06/09/2023)  Social Connections: Unknown (10/07/2022)   Received from [facility]   Social Network    Social Network: Not on file   Allergies   Allergen Reactions   Sulfonamide Derivatives    Family History  Problem Relation Age of Onset   Hypertension Mother    Hypertension Father    Melanoma Father    Melanoma Brother    Colon cancer Maternal Aunt    Breast cancer Maternal Aunt    Breast cancer Maternal Grandmother      Current Outpatient Medications (Cardiovascular):    losartan (COZAAR) 50 MG tablet, Take 1 tablet (50 mg total) by mouth daily.     Current Outpatient Medications (Other):    LUMIGAN 0.01 % SOLN, 1 drop at bedtime.   UNABLE TO FIND, Take 1 packet by mouth daily. Med Name: shaklee vitalizer vitamins   Reviewed prior external information including notes and imaging from  primary care provider As well as notes that were available from care everywhere and other healthcare systems.  Past medical history, social, surgical and family history all reviewed in electronic medical record.  No pertanent information unless stated regarding to the chief complaint.   Review of Systems:  No headache, visual changes, nausea, vomiting, diarrhea, constipation, dizziness, abdominal pain, skin rash, fevers, chills, night sweats, weight loss, swollen lymph nodes, body  aches, joint swelling, chest pain, shortness of breath, mood changes. POSITIVE muscle aches  Objective  Blood pressure 118/84, pulse 64, height 5\' 2"  (1.575 m), weight 120 lb (54.4 kg), SpO2 95%.   General: No apparent distress alert and oriented x3 mood and affect normal, dressed appropriately.  HEENT: Pupils equal, extraocular movements intact  Respiratory: Patient's speak in full sentences and does not appear short of breath  Cardiovascular: No lower extremity edema, non tender, no erythema  Left thumb does have relatively good range of motion.  No thenar eminence wasting.  Positive Finkelstein test noted.  Neurovascularly intact with good capillary refill Right hand shows a patient does have some hypertrophy noted of the PIP joint but otherwise  fairly unremarkable.  Limited muscular skeletal ultrasound was performed and interpreted by Kelly Holder, M   Limited ultrasound shows some hypoechoic changes noted in the abductor pollicis longus tendon sheath.  No increasing in Doppler flow.  No neovascularization.  CMC joint does have some narrowing but mild in severity.  [id number]; 15 additional minutes spent for Therapeutic exercises as stated in above notes.  This included exercises focusing on stretching, strengthening, with significant focus on eccentric aspects.   Long term goals include an improvement in range of motion, strength, endurance as well as avoiding reinjury. Patient's frequency would include in 1-2 times a day, 3-5 times a week for a duration of 6-12 weeks. 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  This anatomy was reviewed with the patient.  Ice bid for 2-3 days at a minimum Place the thumb in a thumb spica splint Hand exercises in a week or so - stress ball then opening hand with rubber bands  If continues to do poorly, may need to inject the 1st dorsal sheath   Proper technique shown and discussed handout in great detail with ATC.  All questions were discussed and answered.     Impression and Recommendations:    The above documentation has been reviewed and is accurate and complete Kelly Saa, DO

## 2023-08-30 ENCOUNTER — Ambulatory Visit: Payer: No Typology Code available for payment source | Admitting: Family Medicine

## 2023-08-30 ENCOUNTER — Encounter: Payer: Self-pay | Admitting: Family Medicine

## 2023-08-30 VITALS — BP 118/84 | HR 64 | Ht 62.0 in | Wt 120.0 lb

## 2023-08-30 DIAGNOSIS — M19041 Primary osteoarthritis, right hand: Secondary | ICD-10-CM | POA: Insufficient documentation

## 2023-08-30 DIAGNOSIS — M654 Radial styloid tenosynovitis [de Quervain]: Secondary | ICD-10-CM

## 2023-08-30 NOTE — Patient Instructions (Signed)
Thumb spica splint Wear for 2 weeks day and night and then for 2 weeks at night Ice for 20 min Voltaren See me in 6-8 weeks

## 2023-08-30 NOTE — Assessment & Plan Note (Signed)
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  Discussed different self manipulation techniques.  Do not feel bracing is necessary at this time.  Seems to have full range of motion and good grip strength still noted.  Follow-up again in 6 weeks

## 2023-08-30 NOTE — Assessment & Plan Note (Signed)
New problem, brace given, home exercises and work with Event organiser, discussed which activities to do and.  Discussed regimen and topical anti-inflammatories.  Discussed frequent vomiting secondary to significant improvement.  Follow-up with me again 2 months

## 2023-09-06 ENCOUNTER — Telehealth: Payer: Self-pay | Admitting: *Deleted

## 2023-09-06 NOTE — Telephone Encounter (Signed)
Pt called stating that she wore her wrist brace for 3 days & then took it off because it was making her thumb numb & its currently still numb. She would like a call back to see what Dr. Katrinka Blazing recommends.

## 2023-09-08 ENCOUNTER — Encounter: Payer: Self-pay | Admitting: *Deleted

## 2023-09-13 ENCOUNTER — Other Ambulatory Visit: Payer: Self-pay

## 2023-09-13 ENCOUNTER — Ambulatory Visit: Payer: No Typology Code available for payment source | Admitting: Family Medicine

## 2023-09-13 ENCOUNTER — Encounter: Payer: Self-pay | Admitting: Family Medicine

## 2023-09-13 VITALS — BP 130/70 | HR 100 | Ht 62.0 in | Wt 118.6 lb

## 2023-09-13 DIAGNOSIS — G8929 Other chronic pain: Secondary | ICD-10-CM | POA: Diagnosis not present

## 2023-09-13 DIAGNOSIS — M79646 Pain in unspecified finger(s): Secondary | ICD-10-CM | POA: Diagnosis not present

## 2023-09-13 DIAGNOSIS — M1812 Unilateral primary osteoarthritis of first carpometacarpal joint, left hand: Secondary | ICD-10-CM

## 2023-09-13 NOTE — Patient Instructions (Addendum)
 Good to see you. [place] ring for the back of the phone.  You can wear the brace without the steel in it.  Ice and topical anti-inflammatories.  See you in 2 months.

## 2023-09-13 NOTE — Progress Notes (Signed)
 [facility] [address] [place] [address] Phone: [phone] Subjective:   Kelly Holder am a scribe for Dr. Felipe Holder.    I'm seeing this patient by the request  of:  Kelly Furth, MD  CC: Thumb pain follow-up  [id number]  Kelly Holder is a 73 y.o. female coming in with complaint of left thumb pain.  Patient was found to have more of a de Quervain's tenosynovitis.  Was to do bracing, home exercises, icing regimen.  Patient states left thumb is numb. Thinks the brace did something to the nerve. Worked only two days Monday and Tuesday. Brace has been off for about two weeks. Outside of thumb is numb but pass the base of thumb is numb.        Past Medical History:  Diagnosis Date   Cataract    early stage   Endometriosis    Hypertension    Migraines    Mitral valve prolapse    Past Surgical History:  Procedure Laterality Date   ABDOMINAL HYSTERECTOMY  08/03/1990   TAH and BSO    BREAST BIOPSY Right    BREAST SURGERY  08/04/1991   biopsy, benign    COLONOSCOPY  06/15/2014   per Dr. Willy Harvest, benign polyps, repeat in 10 yrs    MENISCUS REPAIR     Social History   Socioeconomic History   Marital status: Married    Spouse name: Not on file   Number of children: Not on file   Years of education: Not on file   Highest education level: Not on file  Occupational History   Not on file  Tobacco Use   Smoking status: Never   Smokeless tobacco: Never  Substance and Sexual Activity   Alcohol use: Yes    Alcohol/week: 0.0 standard drinks of alcohol    Comment: glass of wine most nights   Drug use: No   Sexual activity: Not on file  Other Topics Concern   Not on file  Social History Narrative   Not on file   Social Drivers of Health   Financial Resource Strain: Not on file  Food Insecurity: Not on file  Transportation Needs: Not on file  Physical Activity: Not on file  Stress: Not on file (06/09/2023)  Social Connections:  Unknown (10/07/2022)   Received from [facility]   Social Network    Social Network: Not on file   Allergies  Allergen Reactions   Sulfonamide Derivatives    Family History  Problem Relation Age of Onset   Hypertension Mother    Hypertension Father    Melanoma Father    Melanoma Brother    Colon cancer Maternal Aunt    Breast cancer Maternal Aunt    Breast cancer Maternal Grandmother      Current Outpatient Medications (Cardiovascular):    losartan  (COZAAR ) 50 MG tablet, Take 1 tablet (50 mg total) by mouth daily.     Current Outpatient Medications (Other):    LUMIGAN 0.01 % SOLN, 1 drop at bedtime.   UNABLE TO FIND, Take 1 packet by mouth daily. Med Name: shaklee vitalizer vitamins   Reviewed prior external information including notes and imaging from  primary care provider As well as notes that were available from care everywhere and other healthcare systems.  Past medical history, social, surgical and family history all reviewed in electronic medical record.  No pertanent information unless stated regarding to the chief complaint.   Review of Systems:  No headache, visual changes, nausea, vomiting, diarrhea, constipation, dizziness, abdominal pain, skin rash, fevers, chills, night sweats, weight loss, swollen lymph nodes, body aches, joint swelling, chest pain, shortness of breath, mood changes. POSITIVE muscle aches  Objective  Blood pressure 130/70, pulse 100, height 5\' 2"  (1.575 m), weight 118 lb 9.6 oz (53.8 kg).   General: No apparent distress alert and oriented x3 mood and affect normal, dressed appropriately.  HEENT: Pupils equal, extraocular movements intact  Respiratory: Patient's speak in full sentences and does not appear short of breath  Cardiovascular: No lower extremity edema, non tender, no erythema  Left thumb exam shows CMC joint does have more tenderness than previous exam.  Patient has a negative Kelly Holder which is an improvement  noted.  Limited muscular skeletal ultrasound was performed and interpreted by Kelly Holder, M   Limited ultrasound shows that patient does have some hypoechoic changes of the [facility] joint with some increasing in Doppler flow that is consistent with a recent sprain.  No cortical irregularity other than chronic arthritis. Impression: CMC arthritis    Impression and Recommendations:    The above documentation has been reviewed and is accurate and complete Kelly Vane M Glenn Gullickson, DO

## 2023-09-13 NOTE — Assessment & Plan Note (Signed)
 Discussed with patient about icing regimen and home exercises, which activities to do and which ones to avoid.  Discussed with patient about bracing and removed the rigidity of the brace that I think will be more follow-up again 2 to 3 months otherwise.  Worsening pain consider injections.

## 2023-10-14 ENCOUNTER — Ambulatory Visit: Payer: No Typology Code available for payment source | Admitting: Family Medicine

## 2023-10-28 NOTE — Progress Notes (Signed)
 [facility] [address] [place] [address] Phone: [phone] Subjective:   Kelly Holder, am serving as a scribe for Dr. Antoine Primas.  I'm seeing this patient by the request  of:  Nelwyn Salisbury, MD  CC: Left thumb pain  [id number]  09/13/2023 Discussed with patient about icing regimen and home exercises, which activities to do and which ones to avoid.  Discussed with patient about bracing and removed the rigidity of the brace that I think will be more follow-up again 2 to 3 months otherwise.  Worsening pain consider injections.     Update 11/02/2023 Kelly Holder is a 73 y.o. female coming in with complaint of L thumb pain. Patient states that her pain is still the same as last visit. Brace created numbness in thumb that has not gone away. Notes weakness.        Past Medical History:  Diagnosis Date   Cataract    early stage   Endometriosis    Hypertension    Migraines    Mitral valve prolapse    Past Surgical History:  Procedure Laterality Date   ABDOMINAL HYSTERECTOMY  08/03/1990   TAH and BSO    BREAST BIOPSY Right    BREAST SURGERY  08/04/1991   biopsy, benign    COLONOSCOPY  06/15/2014   per Dr. Leone Payor, benign polyps, repeat in 10 yrs    MENISCUS REPAIR     Social History   Socioeconomic History   Marital status: Married    Spouse name: Not on file   Number of children: Not on file   Years of education: Not on file   Highest education level: Not on file  Occupational History   Not on file  Tobacco Use   Smoking status: Never   Smokeless tobacco: Never  Substance and Sexual Activity   Alcohol use: Yes    Alcohol/week: 0.0 standard drinks of alcohol    Comment: glass of wine most nights   Drug use: No   Sexual activity: Not on file  Other Topics Concern   Not on file  Social History Narrative   Not on file   Social Drivers of Health   Financial Resource Strain: Not on file  Food Insecurity: Not on  file  Transportation Needs: Not on file  Physical Activity: Not on file  Stress: Not on file (06/09/2023)  Social Connections: Unknown (10/07/2022)   Received from [facility]   Social Network    Social Network: Not on file   Allergies  Allergen Reactions   Sulfonamide Derivatives    Family History  Problem Relation Age of Onset   Hypertension Mother    Hypertension Father    Melanoma Father    Melanoma Brother    Colon cancer Maternal Aunt    Breast cancer Maternal Aunt    Breast cancer Maternal Grandmother      Current Outpatient Medications (Cardiovascular):    losartan (COZAAR) 50 MG tablet, Take 1 tablet (50 mg total) by mouth daily.     Current Outpatient Medications (Other):    LUMIGAN 0.01 % SOLN, 1 drop at bedtime.   UNABLE TO FIND, Take 1 packet by mouth daily. Med Name: shaklee vitalizer vitamins   Reviewed prior external information including notes and imaging from  primary care provider As well as notes that were available from care everywhere and other healthcare systems.  Past medical history, social, surgical and family history all reviewed in electronic medical record.  No pertanent information unless stated regarding to the chief complaint.   Review of Systems:  No headache, visual changes, nausea, vomiting, diarrhea, constipation, dizziness, abdominal pain, skin rash, fevers, chills, night sweats, weight loss, swollen lymph nodes,  joint swelling, chest pain, shortness of breath, mood changes. POSITIVE muscle aches, body aches   Objective  Blood pressure 118/78, height 5\' 2"  (1.575 m), weight 119 lb (54 kg).   General: No apparent distress alert and oriented x3 mood and affect normal, dressed appropriately.  HEENT: Pupils equal, extraocular movements intact  Respiratory: Patient's speak in full sentences and does not appear short of breath  Cardiovascular: No lower extremity edema, non tender, no erythema  Hand exam does have positive CMC.  Pain  noted.  Positive grind test  Limited muscular skeletal ultrasound was performed and interpreted by Antoine Primas, M  Limited ultrasound shows that patient does have some narrowing noted Mild hypoechoic changes of the [facility] joint noted.  There is what appears to be a cyst though just distal to the [facility] joint underneath the abductor pollicis longus tendon sheath it appears.  Procedure: Real-time Ultrasound Guided Injection of left cyst near the [facility] joint Device: GE Logiq Q7 Ultrasound guided injection is preferred based studies that show increased duration, increased effect, greater accuracy, decreased procedural pain, increased response rate, and decreased cost with ultrasound guided versus blind injection.  Verbal informed consent obtained.  Time-out conducted.  Noted no overlying erythema, induration, or other signs of local infection.  Skin prepped in a sterile fashion.  Local anesthesia: Topical Ethyl chloride.  With sterile technique and under real time ultrasound guidance: With a 25-gauge half inch needle injected with 2 cc of 0.5% Marcaine Completed without difficulty  Pain immediately resolved suggesting accurate placement of the medication.  Advised to call if fevers/chills, erythema, induration, drainage, or persistent bleeding.  Impression: Technically successful ultrasound guided injection.      The above documentation has been reviewed and is accurate and complete Judi Saa, DO

## 2023-11-02 ENCOUNTER — Encounter: Payer: Self-pay | Admitting: Family Medicine

## 2023-11-02 ENCOUNTER — Other Ambulatory Visit: Payer: Self-pay

## 2023-11-02 ENCOUNTER — Ambulatory Visit: Payer: No Typology Code available for payment source | Admitting: Family Medicine

## 2023-11-02 VITALS — BP 118/78 | Ht 62.0 in | Wt 119.0 lb

## 2023-11-02 DIAGNOSIS — M79645 Pain in left finger(s): Secondary | ICD-10-CM

## 2023-11-02 DIAGNOSIS — M1812 Unilateral primary osteoarthritis of first carpometacarpal joint, left hand: Secondary | ICD-10-CM | POA: Diagnosis not present

## 2023-11-02 NOTE — Patient Instructions (Signed)
 Heat and massage See you again in 2 months Good to see you!

## 2023-11-02 NOTE — Assessment & Plan Note (Signed)
 Cyst noted, discussed with patient about icing regimen and home exercises.  Did not truly inject that joint today.  Discussed watching and monitoring.  Did not use any steroid but hopefully will make an improvement.  Follow-up with me again in 6 to 8 weeks may need to consider custom bracing.

## 2023-11-17 ENCOUNTER — Other Ambulatory Visit: Payer: Self-pay | Admitting: Family Medicine

## 2023-11-22 ENCOUNTER — Other Ambulatory Visit: Payer: Self-pay | Admitting: Family Medicine

## 2023-11-22 NOTE — Telephone Encounter (Signed)
 Copied from CRM [id number]. Topic: Clinical - Medication Refill >> Nov 22, 2023  9:18 AM Juluis Ok wrote: Most Recent Primary Care Visit:  Provider: Corita Diego A  Department: [facility]  Visit Type: PHYSICAL  Date: 10/28/2022  Medication: losartan  (COZAAR ) 50 MG tablet, 90 day supply    Has the patient contacted their pharmacy? Yes (Agent: If no, request that the patient contact the pharmacy for the refill. If patient does not wish to contact the pharmacy document the reason why and proceed with request.) (Agent: If yes, when and what did the pharmacy advise?)  Is this the correct pharmacy for this prescription? Yes If no, delete pharmacy and type the correct one.  This is the patient's preferred pharmacy:    [id number] - [place], [place] - [address] AT [facility] [address] [place] [address] Phone: [phone] Fax: [phone]    Has the prescription been filled recently? No  Is the patient out of the medication? No  Has the patient been seen for an appointment in the last year OR does the patient have an upcoming appointment? Yes  Can we respond through MyChart? No, prefers phone call/text  Agent: Please be advised that Rx refills may take up to 3 business days. We ask that you follow-up with your pharmacy.

## 2023-11-26 ENCOUNTER — Telehealth: Payer: Self-pay | Admitting: Family Medicine

## 2023-11-26 ENCOUNTER — Other Ambulatory Visit: Payer: Self-pay | Admitting: Family Medicine

## 2023-11-26 NOTE — Telephone Encounter (Signed)
 Copied from CRM [id number]. Topic: Clinical - Prescription Issue >> Nov 26, 2023 12:43 PM Chuck Crater wrote: Reason for CRM: Patient is calling to check the status of refill for losartan  (COZAAR ) 50 MG tablet. Patient has only 3 pills left. Please call patient back today.

## 2023-12-07 ENCOUNTER — Telehealth: Payer: Self-pay

## 2023-12-07 NOTE — Telephone Encounter (Signed)
 Copied from CRM [id number]. Topic: Clinical - Lab/Test Results >> Dec 07, 2023 11:54 AM Kelly Holder wrote: Reason for CRM: Patient is requesting a call back in regards to know if she needs to fast for her upcoming appointment. Patient states that its ok to leave a voicemail with information or rite her mychart.

## 2023-12-07 NOTE — Telephone Encounter (Signed)
 Left detailed message on pt voicemail advised to fast since her appointment is for a CPE

## 2023-12-13 ENCOUNTER — Encounter (HOSPITAL_COMMUNITY): Payer: Self-pay

## 2023-12-13 ENCOUNTER — Encounter: Admitting: Family Medicine

## 2023-12-20 ENCOUNTER — Encounter: Payer: Self-pay | Admitting: Family Medicine

## 2023-12-20 ENCOUNTER — Ambulatory Visit (INDEPENDENT_AMBULATORY_CARE_PROVIDER_SITE_OTHER): Admitting: Family Medicine

## 2023-12-20 VITALS — BP 130/78 | HR 66 | Temp 98.3°F | Ht 62.0 in | Wt 120.2 lb

## 2023-12-20 DIAGNOSIS — Z0000 Encounter for general adult medical examination without abnormal findings: Secondary | ICD-10-CM

## 2023-12-20 LAB — LIPID PANEL
Cholesterol: 232 mg/dL — ABNORMAL HIGH (ref 0–200)
HDL: 90.3 mg/dL (ref >39.00)
LDL Cholesterol: 132 mg/dL — ABNORMAL HIGH (ref 0–99)
NonHDL: 141.76
Total CHOL/HDL Ratio: 3
Triglycerides: 49 mg/dL (ref 0.0–149.0)
VLDL: 9.8 mg/dL (ref 0.0–40.0)

## 2023-12-20 LAB — CBC WITH DIFFERENTIAL/PLATELET
Basophils Absolute: 0 10*3/uL (ref 0.0–0.1)
Basophils Relative: 0.8 % (ref 0.0–3.0)
Eosinophils Absolute: 0.1 10*3/uL (ref 0.0–0.7)
Eosinophils Relative: 1.3 % (ref 0.0–5.0)
HCT: 41.2 % (ref 36.0–46.0)
Hemoglobin: 13.7 g/dL (ref 12.0–15.0)
Lymphocytes Relative: 39.6 % (ref 12.0–46.0)
Lymphs Abs: 1.8 10*3/uL (ref 0.7–4.0)
MCHC: 33.3 g/dL (ref 30.0–36.0)
MCV: 92.4 fl (ref 78.0–100.0)
Monocytes Absolute: 0.4 10*3/uL (ref 0.1–1.0)
Monocytes Relative: 8.7 % (ref 3.0–12.0)
Neutro Abs: 2.2 10*3/uL (ref 1.4–7.7)
Neutrophils Relative %: 49.6 % (ref 43.0–77.0)
Platelets: 161 10*3/uL (ref 150.0–400.0)
RBC: 4.46 Mil/uL (ref 3.87–5.11)
RDW: 13.7 % (ref 11.5–15.5)
WBC: 4.5 10*3/uL (ref 4.0–10.5)

## 2023-12-20 LAB — COMPREHENSIVE METABOLIC PANEL WITH GFR
ALT: 22 U/L (ref 0–35)
AST: 31 U/L (ref 0–37)
Albumin: 4.4 g/dL (ref 3.5–5.2)
Alkaline Phosphatase: 40 U/L (ref 39–117)
BUN: 13 mg/dL (ref 6–23)
CO2: 27 meq/L (ref 19–32)
Calcium: 9.7 mg/dL (ref 8.4–10.5)
Chloride: 102 meq/L (ref 96–112)
Creatinine, Ser: 0.62 mg/dL (ref 0.40–1.20)
GFR: 89.89 mL/min (ref >60.00)
Glucose, Bld: 89 mg/dL (ref 70–99)
Potassium: 4.7 meq/L (ref 3.5–5.1)
Sodium: 139 meq/L (ref 135–145)
Total Bilirubin: 0.7 mg/dL (ref 0.2–1.2)
Total Protein: 6.8 g/dL (ref 6.0–8.3)

## 2023-12-20 LAB — HEMOGLOBIN A1C: Hgb A1c MFr Bld: 5.3 % (ref 4.6–6.5)

## 2023-12-20 MED ORDER — LOSARTAN POTASSIUM 50 MG PO TABS: 50.0000 mg | ORAL_TABLET | Freq: Every day | ORAL | 3 refills | Status: AC

## 2023-12-20 NOTE — Progress Notes (Signed)
   Subjective:    Patient ID: Kelly Holder, female    DOB: [birth year], 73 y.o.   MRN: [id number]  HPI Here for a well exam. She feels great. She still works full time.    Review of Systems  Constitutional: Negative.   HENT: Negative.    Eyes: Negative.   Respiratory: Negative.    Cardiovascular: Negative.   Gastrointestinal: Negative.   Genitourinary:  Negative for decreased urine volume, difficulty urinating, dyspareunia, dysuria, enuresis, flank pain, frequency, hematuria, pelvic pain and urgency.  Musculoskeletal: Negative.   Skin: Negative.   Neurological: Negative.  Negative for headaches.  Psychiatric/Behavioral: Negative.         Objective:   Physical Exam Constitutional:      General: She is not in acute distress.    Appearance: Normal appearance. She is well-developed.  HENT:     Head: Normocephalic and atraumatic.     Right Ear: External ear normal.     Left Ear: External ear normal.     Nose: Nose normal.     Mouth/Throat:     Pharynx: No oropharyngeal exudate.  Eyes:     General: No scleral icterus.    Conjunctiva/sclera: Conjunctivae normal.     Pupils: Pupils are equal, round, and reactive to light.  Neck:     Thyroid : No thyromegaly.     Vascular: No JVD.  Cardiovascular:     Rate and Rhythm: Normal rate and regular rhythm.     Pulses: Normal pulses.     Heart sounds: Normal heart sounds. No murmur heard.    No friction rub. No gallop.  Pulmonary:     Effort: Pulmonary effort is normal. No respiratory distress.     Breath sounds: Normal breath sounds. No wheezing or rales.  Chest:     Chest wall: No tenderness.  Abdominal:     General: Bowel sounds are normal. There is no distension.     Palpations: Abdomen is soft. There is no mass.     Tenderness: There is no abdominal tenderness. There is no guarding or rebound.  Musculoskeletal:        General: No tenderness. Normal range of motion.     Cervical back: Normal range of motion and neck supple.   Lymphadenopathy:     Cervical: No cervical adenopathy.  Skin:    General: Skin is warm and dry.     Findings: No erythema or rash.  Neurological:     General: No focal deficit present.     Mental Status: She is alert and oriented to person, place, and time.     Cranial Nerves: No cranial nerve deficit.     Motor: No abnormal muscle tone.     Coordination: Coordination normal.     Deep Tendon Reflexes: Reflexes are normal and symmetric. Reflexes normal.  Psychiatric:        Mood and Affect: Mood normal.        Behavior: Behavior normal.        Thought Content: Thought content normal.        Judgment: Judgment normal.           Assessment & Plan:  Well exam. We discussed diet and exercise. Get fasting labs. Corita Diego, MD

## 2023-12-21 LAB — TSH: TSH: 2.28 u[IU]/mL (ref 0.35–5.50)

## 2023-12-21 LAB — HEPATITIS C ANTIBODY: Hepatitis C Ab: NONREACTIVE

## 2023-12-23 ENCOUNTER — Ambulatory Visit: Payer: Self-pay | Admitting: Family Medicine

## 2023-12-31 NOTE — Progress Notes (Signed)
 [facility] [address] [place] [address] Phone: [phone] Subjective:   Kelly Holder, am serving as a scribe for Dr. Ronnell Holder.  I'm seeing this patient by the request  of:  Kelly Furth, MD  CC: Hip pain and low back pain  [id number]  11/02/2023 Cyst noted, discussed with patient about icing regimen and home exercises.  Did not truly inject that joint today.  Discussed watching and monitoring.  Did not use any steroid but hopefully will make an improvement.  Follow-up with me again in 6 to 8 weeks may need to consider custom bracing.     Update 01/03/2024 Kelly Holder is a 73 y.o. female coming in with complaint of L thumb pain. Patient states that she had slight improvement. Feels like this pain is going to stay at this level.   Also c/o pain in R GT at night when she is lying on that side. Painful to get out of car.   Also having L sided lumbar spine pain. Recently she has had pain in more days than not. Did do a lot of lifting last week of heavy items.       Past Medical History:  Diagnosis Date   Cataract    early stage   Endometriosis    Hypertension    Migraines    Mitral valve prolapse    Past Surgical History:  Procedure Laterality Date   ABDOMINAL HYSTERECTOMY  08/03/1990   TAH and BSO    BREAST BIOPSY Right    BREAST SURGERY  08/04/1991   biopsy, benign    COLONOSCOPY  06/15/2014   per Dr. Willy Holder, benign polyps, repeat in 10 yrs    MENISCUS REPAIR     Social History   Socioeconomic History   Marital status: Married    Spouse name: Not on file   Number of children: Not on file   Years of education: Not on file   Highest education level: Not on file  Occupational History   Not on file  Tobacco Use   Smoking status: Never   Smokeless tobacco: Never  Substance and Sexual Activity   Alcohol use: Yes    Alcohol/week: 0.0 standard drinks of alcohol    Comment: glass of wine most nights   Drug use:  No   Sexual activity: Not on file  Other Topics Concern   Not on file  Social History Narrative   Not on file   Social Drivers of Health   Financial Resource Strain: Not on file  Food Insecurity: Not on file  Transportation Needs: Not on file  Physical Activity: Insufficiently Active (12/20/2023)   Exercise Vital Sign    Days of Exercise per Week: 7 days    Minutes of Exercise per Session: 20 min  Stress: Not on file (06/09/2023)  Social Connections: Unknown (10/07/2022)   Received from [facility]   Social Network    Social Network: Not on file   Allergies  Allergen Reactions   Sulfonamide Derivatives    Family History  Problem Relation Age of Onset   Hypertension Mother    Hypertension Father    Melanoma Father    Melanoma Brother    Colon cancer Maternal Aunt    Breast cancer Maternal Aunt    Breast cancer Maternal Grandmother      Current Outpatient Medications (Cardiovascular):    losartan  (COZAAR ) 50 MG tablet, Take 1 tablet (50 mg total) by mouth daily.  Current Outpatient Medications (Other):    LUMIGAN 0.01 % SOLN, 1 drop at bedtime.   UNABLE TO FIND, Take 1 packet by mouth daily. Med Name: shaklee vitalizer vitamins   Reviewed prior external information including notes and imaging from  primary care provider As well as notes that were available from care everywhere and other healthcare systems.  Past medical history, social, surgical and family history all reviewed in electronic medical record.  No pertanent information unless stated regarding to the chief complaint.   Review of Systems:  No headache, visual changes, nausea, vomiting, diarrhea, constipation, dizziness, abdominal pain, skin rash, fevers, chills, night sweats, weight loss, swollen lymph nodes, body aches, joint swelling, chest pain, shortness of breath, mood changes. POSITIVE muscle aches  Objective  Blood pressure 118/74, pulse 64, height 5\' 2"  (1.575 m), weight 120 lb (54.4 kg).    General: No apparent distress alert and oriented x3 mood and affect normal, dressed appropriately.  HEENT: Pupils equal, extraocular movements intact  Respiratory: Patient's speak in full sentences and does not appear short of breath  Cardiovascular: No lower extremity edema, non tender, no erythema  Low back does have some loss lordosis.  Severe tenderness to palpation of the greater trochanteric area.  Positive on the more than the left.  Positive Faber on the right.  Limited muscular skeletal ultrasound was performed and interpreted by Kelly Holder, M  Limited ultrasound of patient's thumb shows hypoechoic changes still noted.  Cyst formation still seems to be noticed proximal to the joint  [id number]; 15 additional minutes spent for Therapeutic exercises as stated in above notes.  This included exercises focusing on stretching, strengthening, with significant focus on eccentric aspects.   Long term goals include an improvement in range of motion, strength, endurance as well as avoiding reinjury. Patient's frequency would include in 1-2 times a day, 3-5 times a week for a duration of 6-12 weeks. Hip strengthening exercises which included:  Pelvic tilt/bracing to help with proper recruitment of the lower abs and pelvic floor muscles  Glute strengthening to properly contract glutes without over-engaging low back and hamstrings - prone hip extension and glute bridge exercises Proper stretching techniques to increase effectiveness for the hip flexors, groin, quads, piriformic and low back when appropriate    Proper technique shown and discussed handout in great detail with ATC.  All questions were discussed and answered.  \   Impression and Recommendations:    The above documentation has been reviewed and is accurate and complete Kelly Holder M Kelly Orrison, DO

## 2024-01-03 ENCOUNTER — Ambulatory Visit (INDEPENDENT_AMBULATORY_CARE_PROVIDER_SITE_OTHER)

## 2024-01-03 ENCOUNTER — Ambulatory Visit: Payer: Self-pay

## 2024-01-03 ENCOUNTER — Ambulatory Visit: Payer: Self-pay | Admitting: Family Medicine

## 2024-01-03 ENCOUNTER — Ambulatory Visit: Admitting: Family Medicine

## 2024-01-03 ENCOUNTER — Encounter: Payer: Self-pay | Admitting: Family Medicine

## 2024-01-03 VITALS — BP 118/74 | HR 64 | Ht 62.0 in | Wt 120.0 lb

## 2024-01-03 DIAGNOSIS — M1812 Unilateral primary osteoarthritis of first carpometacarpal joint, left hand: Secondary | ICD-10-CM

## 2024-01-03 DIAGNOSIS — M545 Low back pain, unspecified: Secondary | ICD-10-CM | POA: Diagnosis not present

## 2024-01-03 DIAGNOSIS — M79645 Pain in left finger(s): Secondary | ICD-10-CM

## 2024-01-03 DIAGNOSIS — M7061 Trochanteric bursitis, right hip: Secondary | ICD-10-CM | POA: Insufficient documentation

## 2024-01-03 DIAGNOSIS — M25551 Pain in right hip: Secondary | ICD-10-CM

## 2024-01-03 NOTE — Assessment & Plan Note (Addendum)
 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  We discussed hip abductor strengthening and ice, which activities to do with this to avoid.  Increase activity slowly over the course the next several weeks.  Discussed icing regimen.  Follow-up again in 6 to 8 weeks worsening pain will consider injection

## 2024-01-03 NOTE — Patient Instructions (Signed)
 Glute tendonitis Xray today Ice and voltaren  after activity and before bed See me again in 2months

## 2024-01-03 NOTE — Assessment & Plan Note (Signed)
 Arthritis noted with cyst still in the area.  Discussed again about the possibility of injection of this.  Patient wants to hold and continue to monitor.  We discussed bracing at night.  We discussed referral to hand surgeon if needed.  Patient will do course like to avoid that if all possible.

## 2024-01-31 ENCOUNTER — Encounter (INDEPENDENT_AMBULATORY_CARE_PROVIDER_SITE_OTHER): Admitting: Ophthalmology

## 2024-01-31 DIAGNOSIS — I10 Essential (primary) hypertension: Secondary | ICD-10-CM

## 2024-01-31 DIAGNOSIS — H43813 Vitreous degeneration, bilateral: Secondary | ICD-10-CM

## 2024-01-31 DIAGNOSIS — H4423 Degenerative myopia, bilateral: Secondary | ICD-10-CM

## 2024-01-31 DIAGNOSIS — H35033 Hypertensive retinopathy, bilateral: Secondary | ICD-10-CM | POA: Diagnosis not present

## 2024-01-31 DIAGNOSIS — H35372 Puckering of macula, left eye: Secondary | ICD-10-CM

## 2024-01-31 DIAGNOSIS — H33303 Unspecified retinal break, bilateral: Secondary | ICD-10-CM

## 2024-02-01 ENCOUNTER — Encounter (INDEPENDENT_AMBULATORY_CARE_PROVIDER_SITE_OTHER): Payer: Medicare Other | Admitting: Ophthalmology

## 2024-03-02 NOTE — Progress Notes (Unsigned)
 Kelly Holder [facility] [address] [place] [address] Phone: [phone] Subjective:   Kelly Holder, am serving as a scribe for Dr. Arthea Holder.  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  We discussed hip abductor strengthening and ice, which activities to do with this to avoid.  Increase activity slowly over the course the next several weeks.  Discussed icing regimen.  Follow-up again in 6 to 8 weeks worsening pain will consider injection   Arthritis noted with cyst still in the area.  Discussed again about the possibility of injection of this.  Patient wants to hold and continue to monitor.  We discussed bracing at night.  We discussed referral to hand surgeon if needed.  Patient will do course like to avoid that if all possible.     Update 03/07/2024 Kelly Holder is a 73 y.o. female coming in with complaint of L thumb, lower back, and R hip pain. Patient states hips are doing better. Doing the exercises when she can. Knows the positions that irritate the hip. Leaving the thumb the way it is. No new concerns.  Patient did have x-ray showing moderate arthritic changes of the low back initially.  Patient also hand x-ray showed moderate arthritic changes of the hand and multiple joints.  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 Avram, benign polyps, repeat in 10 yrs    MENISCUS REPAIR     Social  History   Socioeconomic History   Marital status: Married    Spouse name: Not on file   Number of children: Not on file   Years of education: Not on file   Highest education level: Not on file  Occupational History   Not on file  Tobacco Use   Smoking status: Never   Smokeless tobacco: Never  Substance and Sexual Activity   Alcohol use: Yes    Alcohol/week: 0.0 standard drinks of alcohol    Comment: glass of wine most nights   Drug use: No   Sexual activity: Not on file  Other Topics Concern   Not on file  Social History Narrative   Not on file   Social Drivers of Health   Financial Resource Strain: Not on file  Food Insecurity: Not on file  Transportation Needs: Not on file  Physical Activity: Insufficiently Active (12/20/2023)   Exercise Vital Sign    Days of Exercise per Week: 7 days    Minutes of Exercise per Session: 20 min  Stress: Not on file (06/09/2023)  Social Connections: Unknown (10/07/2022)   Received from [facility]   Social Network    Social Network: Not on file   Allergies  Allergen Reactions   Sulfonamide Derivatives    Family History  Problem Relation Age of Onset   Hypertension Mother  Hypertension Father    Melanoma Father    Melanoma Brother    Colon cancer Maternal Aunt    Breast cancer Maternal Aunt    Breast cancer Maternal Grandmother      Current Outpatient Medications (Cardiovascular):    losartan  (COZAAR ) 50 MG tablet, Take 1 tablet (50 mg total) by mouth daily.     Current Outpatient Medications (Other):    LUMIGAN 0.01 % SOLN, 1 drop at bedtime.   UNABLE TO FIND, Take 1 packet by mouth daily. Med Name: shaklee vitalizer vitamins   Reviewed prior external information including notes and imaging from  primary care provider As well as notes that were available from care everywhere and other healthcare systems.  Past medical history, social, surgical and family history all reviewed in electronic medical record.  No  pertanent information unless stated regarding to the chief complaint.   Review of Systems:  No headache, visual changes, nausea, vomiting, diarrhea, constipation, dizziness, abdominal pain, skin rash, fevers, chills, night sweats, weight loss, swollen lymph nodes, body aches, joint swelling, chest pain, shortness of breath, mood changes. POSITIVE muscle aches  Objective  Blood pressure 110/76, height 5' 2 (1.575 m), weight 118 lb (53.5 kg).   General: No apparent distress alert and oriented x3 mood and affect normal, dressed appropriately.  HEENT: Pupils equal, extraocular movements intact  Respiratory: Patient's speak in full sentences and does not appear short of breath  Cardiovascular: No lower extremity edema, non tender, no erythema  Low back exam shows tightness noted bilaterally in the lower back of the left side.  Patient still has tightness noted in the left side of the hip and over the greater trochanteric area.  This does not seem worse       Impression and Recommendations:    The above documentation has been reviewed and is accurate and complete Kelly Holder M Kelly Pavlich, DO

## 2024-03-07 ENCOUNTER — Other Ambulatory Visit: Payer: Self-pay

## 2024-03-07 ENCOUNTER — Encounter: Payer: Self-pay | Admitting: Family Medicine

## 2024-03-07 ENCOUNTER — Ambulatory Visit: Admitting: Family Medicine

## 2024-03-07 VITALS — BP 110/76 | Ht 62.0 in | Wt 118.0 lb

## 2024-03-07 DIAGNOSIS — M79645 Pain in left finger(s): Secondary | ICD-10-CM | POA: Diagnosis not present

## 2024-03-07 DIAGNOSIS — M7061 Trochanteric bursitis, right hip: Secondary | ICD-10-CM

## 2024-03-07 NOTE — Patient Instructions (Signed)
 Continue to stay active Engage core Try new pushups with DB See me in 3-4 months

## 2024-03-07 NOTE — Assessment & Plan Note (Signed)
 Has had difficulty with both hips previously.  Moderate in the left.  No longer having significant bony discomfort.  We did discuss at length about the home exercises and icing regimen, which activities to do in which ones to avoid.  Increase activity slowly.  Discussed icing regimen.  Follow-up with me again on an as-needed basis.

## 2024-05-19 ENCOUNTER — Other Ambulatory Visit: Payer: Self-pay | Admitting: Family Medicine

## 2024-05-19 DIAGNOSIS — Z1231 Encounter for screening mammogram for malignant neoplasm of breast: Secondary | ICD-10-CM

## 2024-06-22 ENCOUNTER — Ambulatory Visit

## 2024-06-22 ENCOUNTER — Ambulatory Visit
Admission: RE | Admit: 2024-06-22 | Discharge: 2024-06-22 | Disposition: A | Source: Ambulatory Visit | Attending: Family Medicine | Admitting: Family Medicine

## 2024-06-22 DIAGNOSIS — Z1231 Encounter for screening mammogram for malignant neoplasm of breast: Secondary | ICD-10-CM

## 2024-06-27 NOTE — Progress Notes (Signed)
 [facility] [address] [place] [address] Phone: [phone] Subjective:   Kelly Holder Kelly Holder, am serving as a scribe for Dr. Arthea Claudene.  I'm seeing this patient by the request  of:  Johnny Garnette LABOR, MD  CC: left thumb and right hip pain   YEP:Dlagzrupcz  03/07/2024 Has had difficulty with both hips previously.  Moderate in the left.  No longer having significant bony discomfort.  We did discuss at length about the home exercises and icing regimen, which activities to do in which ones to avoid.  Increase activity slowly.  Discussed icing regimen.  Follow-up with me again on an as-needed basis.     Update 06/28/2024 Kelly Holder is a 73 y.o. female coming in with complaint of L thumb and R hip pain. Patient states that she does occasionally have stabbing pain in her thumb.   Has been stretching hip. Pain has been minimal in the hip. Patient is having pain in L SI joint that can be achy at times.   Patient also c/o pain in cervical spine. Mentions playing pool every evening and has c-spine in extended position which can become uncomfortable. Pain is fleeting.     Past Medical History:  Diagnosis Date   Cataract    early stage   Endometriosis    Hypertension    Migraines    Mitral valve prolapse    Past Surgical History:  Procedure Laterality Date   ABDOMINAL HYSTERECTOMY  08/03/1990   TAH and BSO    BREAST BIOPSY Right    BREAST SURGERY  08/04/1991   biopsy, benign    COLONOSCOPY  06/15/2014   per Dr. Avram, benign polyps, repeat in 10 yrs    MENISCUS REPAIR     Social History   Socioeconomic History   Marital status: Married    Spouse name: Not on file   Number of children: Not on file   Years of education: Not on file   Highest education level: Not on file  Occupational History   Not on file  Tobacco Use   Smoking status: Never   Smokeless tobacco: Never  Substance and Sexual Activity   Alcohol use: Yes     Alcohol/week: 0.0 standard drinks of alcohol    Comment: glass of wine most nights   Drug use: No   Sexual activity: Not on file  Other Topics Concern   Not on file  Social History Narrative   Not on file   Social Drivers of Health   Financial Resource Strain: Not on file  Food Insecurity: Not on file  Transportation Needs: Not on file  Physical Activity: Insufficiently Active (12/20/2023)   Exercise Vital Sign    Days of Exercise per Week: 7 days    Minutes of Exercise per Session: 20 min  Stress: Not on file (06/09/2023)  Social Connections: Not on file   Allergies  Allergen Reactions   Sulfonamide Derivatives    Family History  Problem Relation Age of Onset   Hypertension Mother    Hypertension Father    Melanoma Father    Melanoma Brother    Colon cancer Maternal Aunt    Breast cancer Maternal Aunt    Breast cancer Maternal Grandmother      Current Outpatient Medications (Cardiovascular):    losartan  (COZAAR ) 50 MG tablet, Take 1 tablet (50 mg total) by mouth daily.     Current Outpatient Medications (Other):    LUMIGAN 0.01 % SOLN, 1  drop at bedtime.   UNABLE TO FIND, Take 1 packet by mouth daily. Med Name: shaklee vitalizer vitamins   Reviewed prior external information including notes and imaging from  primary care provider As well as notes that were available from care everywhere and other healthcare systems.  Past medical history, social, surgical and family history all reviewed in electronic medical record.  No pertanent information unless stated regarding to the chief complaint.   Review of Systems:  No headache, visual changes, nausea, vomiting, diarrhea, constipation, dizziness, abdominal pain, skin rash, fevers, chills, night sweats, weight loss, swollen lymph nodes, body aches, joint swelling, chest pain, shortness of breath, mood changes. POSITIVE muscle aches  Objective  Blood pressure 128/82, height 5' 2 (1.575 m), weight 118 lb (53.5 kg).    General: No apparent distress alert and oriented x3 mood and affect normal, dressed appropriately.  HEENT: Pupils equal, extraocular movements intact  Respiratory: Patient's speak in full sentences and does not appear short of breath  Cardiovascular: No lower extremity edema, non tender, no erythema  Low back does have some loss lordosis noted.  Tenderness to palpation in the paraspinal musculature.  Left SI joint does have some tenderness to palpation noted.  Left hip has good range of motion noted though. Does have the arthritic changes of the [facility] joint noted. Neck exam does have some limitation and lacking last 5 degrees of extension of the neck.  Relatively good sidebending noted.  Good flexion noted.  [id number]; 15 additional minutes spent for Therapeutic exercises as stated in above notes.  This included exercises focusing on stretching, strengthening, with significant focus on eccentric aspects.   Long term goals include an improvement in range of motion, strength, endurance as well as avoiding reinjury. Patient's frequency would include in 1-2 times a day, 3-5 times a week for a duration of 6-12 weeks.Exercises that included:  Basic scapular stabilization to include adduction and depression of scapula Scaption, focusing on proper movement and good control Internal and External rotation utilizing a theraband, with elbow tucked at side entire time Rows with theraband    Proper technique shown and discussed handout in great detail with ATC.  All questions were discussed and answered.     Impression and Recommendations:    The above documentation has been reviewed and is accurate and complete Kelly Montuori M Huntley Demedeiros, DO

## 2024-07-03 ENCOUNTER — Ambulatory Visit: Admitting: Family Medicine

## 2024-07-03 ENCOUNTER — Other Ambulatory Visit: Payer: Self-pay

## 2024-07-03 ENCOUNTER — Ambulatory Visit

## 2024-07-03 ENCOUNTER — Encounter: Payer: Self-pay | Admitting: Family Medicine

## 2024-07-03 VITALS — BP 128/82 | Ht 62.0 in | Wt 118.0 lb

## 2024-07-03 DIAGNOSIS — M1812 Unilateral primary osteoarthritis of first carpometacarpal joint, left hand: Secondary | ICD-10-CM

## 2024-07-03 DIAGNOSIS — M542 Cervicalgia: Secondary | ICD-10-CM | POA: Diagnosis not present

## 2024-07-03 DIAGNOSIS — M5030 Other cervical disc degeneration, unspecified cervical region: Secondary | ICD-10-CM | POA: Insufficient documentation

## 2024-07-03 DIAGNOSIS — M7061 Trochanteric bursitis, right hip: Secondary | ICD-10-CM

## 2024-07-03 DIAGNOSIS — G8929 Other chronic pain: Secondary | ICD-10-CM

## 2024-07-03 NOTE — Assessment & Plan Note (Signed)
 Stable at the moment.  No significant difficulty, continue to monitor.

## 2024-07-03 NOTE — Assessment & Plan Note (Signed)
 Doing much better than last exam.  Having more low overcompensation of the left sacroiliac joint.  Likely some facet arthropathy with looking at the imaging previously.  Follow-up again in 6 to 12 weeks

## 2024-07-03 NOTE — Patient Instructions (Addendum)
 Xray today Scapular exercises Avoid repetitive extension See me again in 3 momnths

## 2024-07-03 NOTE — Assessment & Plan Note (Signed)
 New problem.  Given exercises, discussed icing regimen and home exercises, increase activity slowly.  Discussed icing regimen.  Follow-up again in 6 to 8 weeks worsening pain may need to consider advanced imaging of the neck but I am not sure that ergonomics will be beneficial for this individual.

## 2024-07-09 ENCOUNTER — Ambulatory Visit: Payer: Self-pay | Admitting: Family Medicine

## 2024-08-19 ENCOUNTER — Encounter: Payer: Self-pay | Admitting: Internal Medicine

## 2024-09-01 ENCOUNTER — Encounter: Payer: Self-pay | Admitting: Internal Medicine

## 2024-10-02 ENCOUNTER — Encounter

## 2024-10-03 ENCOUNTER — Ambulatory Visit: Admitting: Family Medicine

## 2024-10-16 ENCOUNTER — Encounter: Admitting: Internal Medicine

## 2025-02-12 ENCOUNTER — Encounter (INDEPENDENT_AMBULATORY_CARE_PROVIDER_SITE_OTHER): Admitting: Ophthalmology
